# Patient Record
Sex: Female | Born: 1970 | Race: White | Hispanic: No | Marital: Single | Smoking: Never smoker
Health system: Southern US, Community
[De-identification: ages and names within clinical notes are randomized; demographics above are authoritative.]

## PROBLEM LIST (undated history)

## (undated) ENCOUNTER — Inpatient Hospital Stay (HOSPITAL_COMMUNITY): Payer: Self-pay

## (undated) DIAGNOSIS — O10913 Unspecified pre-existing hypertension complicating pregnancy, third trimester: Secondary | ICD-10-CM

## (undated) DIAGNOSIS — O10919 Unspecified pre-existing hypertension complicating pregnancy, unspecified trimester: Secondary | ICD-10-CM

## (undated) DIAGNOSIS — I10 Essential (primary) hypertension: Secondary | ICD-10-CM

## (undated) DIAGNOSIS — G43909 Migraine, unspecified, not intractable, without status migrainosus: Secondary | ICD-10-CM

## (undated) HISTORY — PX: NASAL FRACTURE SURGERY: SHX718

## (undated) HISTORY — PX: NO PAST SURGERIES: SHX2092

---

## 2002-07-07 ENCOUNTER — Other Ambulatory Visit: Admission: RE | Admit: 2002-07-07 | Discharge: 2002-07-07 | Payer: Self-pay | Admitting: Obstetrics & Gynecology

## 2003-07-21 ENCOUNTER — Other Ambulatory Visit: Admission: RE | Admit: 2003-07-21 | Discharge: 2003-07-21 | Payer: Self-pay | Admitting: Obstetrics & Gynecology

## 2004-10-05 ENCOUNTER — Emergency Department (HOSPITAL_COMMUNITY): Admission: EM | Admit: 2004-10-05 | Discharge: 2004-10-05 | Payer: Self-pay | Admitting: Emergency Medicine

## 2010-08-31 ENCOUNTER — Other Ambulatory Visit: Payer: Self-pay | Admitting: Family Medicine

## 2010-08-31 ENCOUNTER — Ambulatory Visit
Admission: RE | Admit: 2010-08-31 | Discharge: 2010-08-31 | Disposition: A | Discharge status: Home / Self Care | Payer: BC Managed Care – PPO | Source: Ambulatory Visit | Attending: Family Medicine | Admitting: Family Medicine

## 2010-08-31 DIAGNOSIS — M79605 Pain in left leg: Secondary | ICD-10-CM

## 2012-06-13 ENCOUNTER — Encounter (HOSPITAL_COMMUNITY): Payer: Self-pay | Admitting: Emergency Medicine

## 2012-06-13 ENCOUNTER — Emergency Department (HOSPITAL_COMMUNITY)
Admission: EM | Admit: 2012-06-13 | Discharge: 2012-06-13 | Disposition: A | Discharge status: Home / Self Care | Payer: BC Managed Care – PPO | Attending: Emergency Medicine | Admitting: Emergency Medicine

## 2012-06-13 DIAGNOSIS — I10 Essential (primary) hypertension: Secondary | ICD-10-CM

## 2012-06-13 DIAGNOSIS — O9989 Other specified diseases and conditions complicating pregnancy, childbirth and the puerperium: Secondary | ICD-10-CM | POA: Insufficient documentation

## 2012-06-13 DIAGNOSIS — O169 Unspecified maternal hypertension, unspecified trimester: Secondary | ICD-10-CM | POA: Insufficient documentation

## 2012-06-13 DIAGNOSIS — G43909 Migraine, unspecified, not intractable, without status migrainosus: Secondary | ICD-10-CM | POA: Insufficient documentation

## 2012-06-13 DIAGNOSIS — Z3490 Encounter for supervision of normal pregnancy, unspecified, unspecified trimester: Secondary | ICD-10-CM

## 2012-06-13 DIAGNOSIS — Z79899 Other long term (current) drug therapy: Secondary | ICD-10-CM | POA: Insufficient documentation

## 2012-06-13 DIAGNOSIS — R110 Nausea: Secondary | ICD-10-CM | POA: Insufficient documentation

## 2012-06-13 HISTORY — DX: Migraine, unspecified, not intractable, without status migrainosus: G43.909

## 2012-06-13 LAB — CBC WITH DIFFERENTIAL/PLATELET
Basophils Relative: 0 % (ref 0–1)
Eosinophils Absolute: 0.1 10*3/uL (ref 0.0–0.7)
HCT: 42.2 % (ref 36.0–46.0)
Hemoglobin: 14.6 g/dL (ref 12.0–15.0)
Lymphs Abs: 2.3 10*3/uL (ref 0.7–4.0)
MCH: 29.8 pg (ref 26.0–34.0)
MCHC: 34.6 g/dL (ref 30.0–36.0)
MCV: 86.1 fL (ref 78.0–100.0)
Monocytes Absolute: 0.5 10*3/uL (ref 0.1–1.0)
Monocytes Relative: 7 % (ref 3–12)
Neutrophils Relative %: 58 % (ref 43–77)
RBC: 4.9 MIL/uL (ref 3.87–5.11)

## 2012-06-13 LAB — URINALYSIS, ROUTINE W REFLEX MICROSCOPIC
Bilirubin Urine: NEGATIVE
Hgb urine dipstick: NEGATIVE
Specific Gravity, Urine: 1.012 (ref 1.005–1.030)
pH: 6.5 (ref 5.0–8.0)

## 2012-06-13 LAB — COMPREHENSIVE METABOLIC PANEL
Alkaline Phosphatase: 67 U/L (ref 39–117)
BUN: 7 mg/dL (ref 6–23)
Creatinine, Ser: 0.54 mg/dL (ref 0.50–1.10)
GFR calc Af Amer: >90 mL/min (ref >90)
Glucose, Bld: 93 mg/dL (ref 70–99)
Potassium: 3.4 mEq/L — ABNORMAL LOW (ref 3.5–5.1)
Total Bilirubin: 0.4 mg/dL (ref 0.3–1.2)
Total Protein: 7.1 g/dL (ref 6.0–8.3)

## 2012-06-13 LAB — POCT PREGNANCY, URINE: Preg Test, Ur: POSITIVE — AB

## 2012-06-13 NOTE — ED Notes (Signed)
Pt presenting to ed with c/o 5-[redacted] weeks pregnant and pt states my blood pressure is high. Pt states "I have dizziness, positive nausea no vomiting and ears ringing. Pt states I have mostly feel dizzy. Pt states I feel like I'm in a time warp. Pt states she was told her HCG levels had dropped and she's suppose to follow up tomorrow for repeat blood work. Pt denies chest pain at this time

## 2012-06-13 NOTE — ED Provider Notes (Signed)
History     CSN: [id number]  Arrival date & time 06/13/12  1753   First MD Initiated Contact with Patient 06/13/12 1923      Chief Complaint  Patient presents with  . Possible Pregnancy  . Dizziness    (Consider location/radiation/quality/duration/timing/severity/associated sxs/prior treatment) The history is provided by the patient.   42 year old female comes in because her blood pressure is elevated today. She monitors her blood pressure at home and most days, readings are normal. Today the blood pressure was 177/100. She is complaining of some very vague dizziness. Of note, she is pregnant with last menstrual period of November 16. She been seen by a gynecologist 3 days ago who estimated that she was 5-[redacted] weeks pregnant based on her hCG level. However, a repeat hCG level yesterday had dropped by 200 and she is scheduled for a repeat hCG level tomorrow. She is gravida 2 para 0 with one voluntary interruption of pregnancy. She has been having some mild intermittent nausea but is not nauseated currently. She did have breast swelling and tenderness several days ago, but that has resolved. She denies visual change and denies vomiting. She denies arthralgias and myalgias. She denies fever or chills.  Past Medical History  Diagnosis Date  . Migraines     No past surgical history on file.  No family history on file.  History  Substance Use Topics  . Smoking status: Never Smoker   . Smokeless tobacco: Not on file  . Alcohol Use: Yes     Comment: rarely    OB History    Grav Para Term Preterm Abortions TAB SAB Ect Mult Living   1               Review of Systems  All other systems reviewed and are negative.    Allergies  Review of patient's allergies indicates no known allergies.  Home Medications   Current Outpatient Rx  Name  Route  Sig  Dispense  Refill  . PRENATAL MULTIVITAMIN CH   Oral   Take 1 tablet by mouth daily.         Marland Kitchen RIZATRIPTAN BENZOATE 5 MG PO  TABS   Oral   Take 5 mg by mouth as needed. May repeat in 2 hours if needed for migraine.           BP 153/93  Pulse 84  Temp 99 F (37.2 C) (Oral)  SpO2 100%  Physical Exam  Nursing note and vitals reviewed.  42 year old female, resting comfortably and in no acute distress. Vital signs are significant for mild hypertension with blood pressure 153/93. Oxygen saturation is 100%, which is normal. Head is normocephalic and atraumatic. PERRLA, EOMI. Oropharynx is clear. Neck is nontender and supple without adenopathy or JVD. Back is nontender and there is no CVA tenderness. Lungs are clear without rales, wheezes, or rhonchi. Chest is nontender. Heart has regular rate and rhythm without murmur. Abdomen is soft, flat, nontender without masses or hepatosplenomegaly and peristalsis is normoactive. Extremities have no cyanosis or edema, full range of motion is present. Skin is warm and dry without rash. Neurologic: Mental status is normal, cranial nerves are intact, there are no motor or sensory deficits.  ED Course  Procedures (including critical care time)  Results for orders placed during the hospital encounter of 06/13/12  CBC WITH DIFFERENTIAL      Component Value Range   WBC 6.7  4.0 - 10.5 K/uL   RBC 4.90  3.87 -  5.11 MIL/uL   Hemoglobin 14.6  12.0 - 15.0 g/dL   HCT 16.1  09.6 - 04.5 %   MCV 86.1  78.0 - 100.0 fL   MCH 29.8  26.0 - 34.0 pg   MCHC 34.6  30.0 - 36.0 g/dL   RDW 40.9  81.1 - 91.4 %   Platelets 187  150 - 400 K/uL   Neutrophils Relative 58  43 - 77 %   Neutro Abs 3.8  1.7 - 7.7 K/uL   Lymphocytes Relative 34  12 - 46 %   Lymphs Abs 2.3  0.7 - 4.0 K/uL   Monocytes Relative 7  3 - 12 %   Monocytes Absolute 0.5  0.1 - 1.0 K/uL   Eosinophils Relative 1  0 - 5 %   Eosinophils Absolute 0.1  0.0 - 0.7 K/uL   Basophils Relative 0  0 - 1 %   Basophils Absolute 0.0  0.0 - 0.1 K/uL  COMPREHENSIVE METABOLIC PANEL      Component Value Range   Sodium 138  135 - 145  mEq/L   Potassium 3.4 (*) 3.5 - 5.1 mEq/L   Chloride 102  96 - 112 mEq/L   CO2 25  19 - 32 mEq/L   Glucose, Bld 93  70 - 99 mg/dL   BUN 7  6 - 23 mg/dL   Creatinine, Ser 7.82  0.50 - 1.10 mg/dL   Calcium 9.4  8.4 - 95.6 mg/dL   Total Protein 7.1  6.0 - 8.3 g/dL   Albumin 4.2  3.5 - 5.2 g/dL   AST 16  0 - 37 U/L   ALT 12  0 - 35 U/L   Alkaline Phosphatase 67  39 - 117 U/L   Total Bilirubin 0.4  0.3 - 1.2 mg/dL   GFR calc non Af Amer >90  >90 mL/min   GFR calc Af Amer >90  >90 mL/min  POCT PREGNANCY, URINE      Component Value Range   Preg Test, Ur POSITIVE (*) NEGATIVE    1. Hypertension   2. Pregnancy       MDM  Isolated elevated blood pressure readings which does not warrant any intervention. I explained this to the patient. The important thing is that her usual blood pressure readings are normal. Does have a family history of hypertension and she is advised to continue monitoring her blood pressure but is reassured that isolated elevated readings do not require treatment. She is to followup with her gynecologist tomorrow as scheduled. Other symptoms are minor and did not require any intervention and her workup today.        Dione Booze, MD 06/13/12 [phone]

## 2012-06-13 NOTE — ED Notes (Signed)
MD at bedside.  Dr. Glick at bedside 

## 2012-10-14 LAB — OB RESULTS CONSOLE ABO/RH: RH Type: POSITIVE

## 2012-10-14 LAB — OB RESULTS CONSOLE RPR: RPR: NONREACTIVE

## 2012-10-14 LAB — OB RESULTS CONSOLE HGB/HCT, BLOOD
HCT: 38 %
Hemoglobin: 12.8 g/dL

## 2012-10-23 LAB — OB RESULTS CONSOLE GC/CHLAMYDIA: Gonorrhea: NEGATIVE

## 2012-12-25 ENCOUNTER — Other Ambulatory Visit: Payer: Self-pay

## 2012-12-27 ENCOUNTER — Encounter (HOSPITAL_COMMUNITY): Payer: Self-pay | Admitting: Obstetrics & Gynecology

## 2013-01-06 ENCOUNTER — Other Ambulatory Visit (HOSPITAL_COMMUNITY): Payer: Self-pay | Admitting: Obstetrics & Gynecology

## 2013-01-06 DIAGNOSIS — O09529 Supervision of elderly multigravida, unspecified trimester: Secondary | ICD-10-CM

## 2013-01-06 DIAGNOSIS — R9389 Abnormal findings on diagnostic imaging of other specified body structures: Secondary | ICD-10-CM

## 2013-01-07 ENCOUNTER — Ambulatory Visit (HOSPITAL_COMMUNITY)
Admission: RE | Admit: 2013-01-07 | Discharge: 2013-01-07 | Disposition: A | Discharge status: Home / Self Care | Payer: BC Managed Care – PPO | Source: Ambulatory Visit | Attending: Obstetrics & Gynecology | Admitting: Obstetrics & Gynecology

## 2013-01-07 ENCOUNTER — Other Ambulatory Visit: Payer: Self-pay

## 2013-01-07 ENCOUNTER — Encounter (HOSPITAL_COMMUNITY): Payer: Self-pay

## 2013-01-07 ENCOUNTER — Other Ambulatory Visit (HOSPITAL_COMMUNITY): Payer: Self-pay | Admitting: Obstetrics & Gynecology

## 2013-01-07 VITALS — BP 116/76 | HR 75 | Wt 155.0 lb

## 2013-01-07 DIAGNOSIS — O4412 Placenta previa with hemorrhage, second trimester: Secondary | ICD-10-CM

## 2013-01-07 DIAGNOSIS — R9389 Abnormal findings on diagnostic imaging of other specified body structures: Secondary | ICD-10-CM

## 2013-01-07 DIAGNOSIS — O358XX1 Maternal care for other (suspected) fetal abnormality and damage, fetus 1: Secondary | ICD-10-CM

## 2013-01-07 DIAGNOSIS — O4400 Placenta previa specified as without hemorrhage, unspecified trimester: Secondary | ICD-10-CM | POA: Insufficient documentation

## 2013-01-07 DIAGNOSIS — Z1389 Encounter for screening for other disorder: Secondary | ICD-10-CM | POA: Insufficient documentation

## 2013-01-07 DIAGNOSIS — O10019 Pre-existing essential hypertension complicating pregnancy, unspecified trimester: Secondary | ICD-10-CM | POA: Insufficient documentation

## 2013-01-07 DIAGNOSIS — O09522 Supervision of elderly multigravida, second trimester: Secondary | ICD-10-CM

## 2013-01-07 DIAGNOSIS — O09529 Supervision of elderly multigravida, unspecified trimester: Secondary | ICD-10-CM | POA: Insufficient documentation

## 2013-01-07 DIAGNOSIS — O4402 Placenta previa specified as without hemorrhage, second trimester: Secondary | ICD-10-CM

## 2013-01-07 DIAGNOSIS — Z363 Encounter for antenatal screening for malformations: Secondary | ICD-10-CM | POA: Insufficient documentation

## 2013-01-07 DIAGNOSIS — O358XX0 Maternal care for other (suspected) fetal abnormality and damage, not applicable or unspecified: Secondary | ICD-10-CM | POA: Insufficient documentation

## 2013-01-07 LAB — CHROMOSOMES ANALYSIS FOR CF

## 2013-01-07 NOTE — Consult Note (Signed)
MFM consult  [age] yr old G4P0030 at [redacted]w[redacted]d with chronic hypertension referred by Dr. Seymour Bars for fetal anatomic survey and consult secondary to finding of echogenic bowel on outside ultrasound.  Past OB hx: 2 miscarriages; 1 termination PMH: hypertension- since early 60s  Ultrasound today shows: fetal biometry is consistent with dating. Posterior placenta that is low lying; the placental edge is 1.6cm from the internal cervical os. Normal amniotic fluid volume. Normal transvaginal cervical length. The views of the ductal and aortic arch are limited. The remainder of the limited anatomy survey is normal. The fetal bowel appears normal.  I discussed the findings of the ultrasound with the patient and counseled her as follows: 1. Appropriate fetal growth. 2. Limited anatomy survey: - recommend follow up in 4 weeks to complete anatomic survey and for fetal growth 3. Chronic hypertension: I discussed that women with preexistent hypertension are at increased risk of adverse pregnancy outcome, including preeclampsia, abruption, fetal growth restriction, and perinatal death. The risk increases with severity of hypertension and presence of end-organ damage. Risk of superimposed preeclampsia is 10 to 25 %; risk of abruption is 0.7 to 1.5 %; and risk of fetal growth restriction is 8 to 16 %. I also discussed that risk of preterm delivery is increased and is usually iatrogenic from the complications listed above. Risk of preterm delivery in women with chronic hypertension is 12-34%. There is also an increased risk of requiring C section for the above reasons. Patient' hypertension is currently managed with methyldopa and labetalol. We discussed that methyldopa is a category B medication and is considered safe in pregnancy as adverse events have not been seen with use of this medication in pregnancy.Labetalol is a category C medication and may be associated with an increased risk of fetal growth restriction;  however is consider safe for use in pregnancy. Patient reports blood pressures were not well controlled with methyldopa alone and is only on labetalol 100mg  bid. Would recommend discontinue methyldopa and treat with single agent labetalol and titrate as needed I would recommend targeting therapy to keep systolic blood pressures <150 and diastolic blood pressures <100. I recommend obtaining baseline studies: EKG, 24 hour urine for total protein, CBC, AST, ALT, BUN, creatinine, and uric acid. I recommend serial ultrasounds for fetal growth every 4 weeks starting at [redacted] weeks gestation. I recommend close surveillance for the development of signs/symptoms of preeclampsia especially in the third trimester. I recommend antenatal testing to start at [redacted] weeks gestation. - recommend delivery by estimated due date but not prior to 38 weeks in the absence of other complications 4. Advanced maternal age: - discussed increased risk of fetal aneuploidy - patient met with genetic counselor; see separate report - after counseling patient opted to proceed with cell free fetal DNA which was drawn today  With maternal age over 67 there is increased risk of gestational diabetes, fetal growth restriction, need for Cesarean delivery, and stillbirth. Recommend starting fetal kick counts at [redacted] weeks gestation. - recommend fetal growth, antenatal testing, and delivery as above 5. Previous finding of echogenic bowel: - discussed that this was not seen on today's ultrasound - I discussed the associations of echogenic bowel with fetal aneuploidy, cystic fibrosis, congenital infection, and bleeding.  Aneuploidy is found in 3-25% of fetuses with echogenic bowel.  Cystic fibrosis is found in 3-8% of fetuses with echogenic bowel.  There is a higher risk of fetal growth restriction in fetuses with echogenic bowel (up to 20%).  Congenital infections such as  toxoplasmosis and CMV have also been associated with echogenic bowel.   Given the above patient was counseled and opted to proceed with screening: - cell free fetal DNA, cystic fibrosis, and toxomplasmosis and CMV IgG and IgM drawn today 6. Low lying placenta: - discussed increased risk of bleeding; patient is to call primary OB with vagina bleeding - discussed will reevaluate on follow up ultrasounds as there is a high likelihood this will resolve prior to delivery; delivery recommendations will be made based on placental location at the time - no activity restrictions recommended unless bleeding develops - recommend bleeding precautions - recommend reevaluate placental location on follow up ultrasound  I spent 45 minutes in face to face consultation with the patient in addition to time spent on the ultrasound.  Eulis Foster, MD

## 2013-01-07 NOTE — Progress Notes (Signed)
[facility] ultrasound   Indication: 19/42 yr old G34P0030 at [redacted]w[redacted]d with chronic hypertension for fetal anatomic survey. Finding of echogenic bowel on outside ultrasound.  Findings: 1. Single intrauterine pregnancy. 2. Fetal biometry is consistent with dating. 3. Posterior placenta that is low lying; the placental edge is 1.6cm from the internal cervical os. 4. Normal amniotic fluid volume. 5. Normal transvaginal cervical length. 6. The views of the ductal and aortic arch are limited. 7. The remainder of the limited anatomy survey is normal. The fetal bowel appears normal.  Recommendations: 1. Appropriate fetal growth. 2. Limited anatomy survey: - recommend follow up in 4 weeks to complete anatomic survey and for fetal growth 3. Chronic hypertension: - see consult letter - recommend fetal growth every 4 weeks - recommend antenatal testing starting at [redacted] weeks gestation - recommend delivery by estimated due date but not prior to 38 weeks in the absence of other complications - recommend close surveillance for the development of signs/symptoms of preeclampsia 4. Advanced maternal age: - see consult letter - patient met with genetic counselor; see separate report - after counseling patient opted to proceed with cell free fetal DNA which was drawn today - recommend fetal growth, antenatal testing, and delivery as above 5. Previous finding of echogenic bowel: - see consult letter - not seen on today's ultrasound - cell free fetal DNA, cystic fibrosis, and toxomplasmosis and CMV IgG and IgM drawn today 6. Low lying placenta: - see consult letter - no activity restrictions recommended unless bleeding develops - recommend bleeding precautions - recommend reevaluate placental location on follow up ultrasound  Eulis Foster, MD

## 2013-01-07 NOTE — Progress Notes (Signed)
Genetic Counseling  High-Risk Gestation Note  Appointment Date:  01/07/2013 Referred By: Genia Del, MD Date of Birth:  Sep 30, 1970    Pregnancy History: G4P0030 Estimated Date of Delivery: 05/20/13 Estimated Gestational Age: [redacted]w[redacted]d Attending: Eulis Foster, MD  Ms. Kayla Hill was seen for genetic counseling because of a maternal age of 42 y.o.Marland Kitchen  The patient will be 42 years old at delivery. Echogenic bowel was reportedly visualized on previous, outside ultrasound.   She was counseled regarding maternal age and the association with risk for chromosome conditions due to nondisjunction with aging of the ova.   We reviewed chromosomes, nondisjunction, and the associated 1 in 35 risk for fetal aneuploidy at [redacted]w[redacted]d gestation related to a maternal age of 42 y.o. at delivery.  She was counseled that the risk for aneuploidy decreases as gestational age increases, accounting for those pregnancies which spontaneously abort.  We specifically discussed Down syndrome (trisomy 43), trisomies 13 and 65, and sex chromosome aneuploidies (47,XXX and 47,XXY) including the common features and prognoses of each.   We reviewed available screening options including Quad screen, noninvasive prenatal screening (NIPS)/cell free fetal DNA (cffDNA) testing, and detailed ultrasound.  Ms. Kayla Hill previously had maternal serum AFP only performed which was within normal range, decreasing the risk for fetal open neural tube defects. She was counseled that screening tests are used to modify a patient's a priori risk for aneuploidy, typically based on age. This estimate provides a pregnancy specific risk assessment. We reviewed the benefits and limitations of each option. Specifically, we discussed the conditions for which each test screens, the detection rates, and false positive rates of each. She was also counseled regarding diagnostic testing via amniocentesis. We reviewed the approximate 1 in 300-500 risk for  complications for amniocentesis, including spontaneous pregnancy loss. After consideration of all the options, she elected to proceed with NIPS (Panorama) at the time of today's visit.  Those results will be available in approximately 8-10 days.  Ms. Kayla Hill indicated that she and her partner would not likely be interested in amniocentesis in the pregnancy, given the associated risk of complications.   A detailed ultrasound was performed today. The ultrasound report will be sent under separate cover. There were no visualized fetal anomalies or markers suggestive of aneuploidy. Echogenic bowel was not visualized today. Diagnostic testing was declined today.  She understands that screening tests cannot rule out all birth defects or genetic syndromes. The patient was advised of this limitation and states she still does not want additional testing at this time.   Ms. Kayla Hill was provided with information regarding cystic fibrosis (CF) including the carrier frequency and incidence in the Caucasian population, the availability of carrier testing and prenatal diagnosis if indicated.  In addition, we discussed that CF is routinely screened for as part of the [facility] newborn screening panel.  After careful consideration, she elected to proceed with CF carrier screening at the time of today's visit.    Both family histories were reviewed and found to be noncontributory for birth defects, mental retardation, and known genetic conditions. Without further information regarding the provided family history, an accurate genetic risk cannot be calculated. Further genetic counseling is warranted if more information is obtained.  Ms. Kayla Hill denied exposure to environmental toxins or chemical agents. She denied the use of alcohol, tobacco or street drugs. She denied significant viral illnesses during the course of her pregnancy. Her medical and surgical histories were contributory for hypertension, for which she takes  labetalol.  I counseled Ms. Kayla Hill regarding the above risks and available options.  The approximate face-to-face time with the genetic counselor was 35 minutes.  Quinn Plowman, MS,  Certified Genetic Counselor 01/07/2013

## 2013-01-09 LAB — TOXOPLASMA ANTIBODIES- IGG AND  IGM: Toxoplasma Antibody- IgM: <3 IU/mL (ref <8.0)

## 2013-01-10 NOTE — Addendum Note (Signed)
Encounter addended by: Ty Hilts, RN on: 01/10/2013 11:16 AM<BR>     Documentation filed: Notes Section

## 2013-01-10 NOTE — ED Notes (Signed)
Elevated CMV result called to Dr. Vincenza Hews.

## 2013-01-15 ENCOUNTER — Other Ambulatory Visit: Payer: Self-pay

## 2013-01-15 ENCOUNTER — Ambulatory Visit (HOSPITAL_COMMUNITY)
Admission: RE | Admit: 2013-01-15 | Discharge: 2013-01-15 | Disposition: A | Discharge status: Home / Self Care | Payer: BC Managed Care – PPO | Source: Ambulatory Visit | Attending: Obstetrics & Gynecology | Admitting: Obstetrics & Gynecology

## 2013-01-15 DIAGNOSIS — O10019 Pre-existing essential hypertension complicating pregnancy, unspecified trimester: Secondary | ICD-10-CM | POA: Insufficient documentation

## 2013-01-15 DIAGNOSIS — O4400 Placenta previa specified as without hemorrhage, unspecified trimester: Secondary | ICD-10-CM | POA: Insufficient documentation

## 2013-01-15 DIAGNOSIS — O358XX0 Maternal care for other (suspected) fetal abnormality and damage, not applicable or unspecified: Secondary | ICD-10-CM | POA: Insufficient documentation

## 2013-01-17 ENCOUNTER — Telehealth (HOSPITAL_COMMUNITY): Payer: Self-pay | Admitting: MS"

## 2013-01-17 NOTE — Telephone Encounter (Signed)
Called Kayla Hill to discuss her cell free fetal DNA test results.  Mrs. Sharyn Lull Czaja had Panorama testing through [facility].  Testing was offered because of advanced maternal age and previous ultrasound finding of echogenic bowel.   The patient was identified by name and DOB.  We reviewed that these are within normal limits, showing a less than 1 in 10,000 risk for trisomies 21, 18 and 13, and monosomy X (Turner syndrome).  In addition, the risk for triploidy/vanishing twin and sex chromosome trisomies (47,XXX and 47,XXY) was also low risk. We reviewed that this testing identifies > 99% of pregnancies with trisomy 59, trisomy 36, trisomy 19, sex chromosome trisomies (47,XXX and 47,XXY), and triploidy.  The detection rate for monosomy X is ~92%.  The false positive rate is <0.1% for all conditions. Testing was also consistent with female gender.  She understands that this testing does not identify all genetic conditions.   Additionally, we reviewed that carrier screening for cystic fibrosis, performed through [facility] yielded a normal/negative result for the 32 most common disease-causing mutations, meaning that the risk to be a CF carrier can be reduced from 1 in 25 to approximately 1 in 125.  Therefore, the risk for cystic fibrosis in her current pregnancy is significantly reduced. The patient understands that CF carrier screening can reduce but not eliminate the chance to be a CF carrier.  All questions were answered to her satisfaction, she was encouraged to call with additional questions or concerns.  Quinn Plowman, MS Certified Genetic Counselor 01/17/2013 8:50 AM

## 2013-02-05 ENCOUNTER — Ambulatory Visit (HOSPITAL_COMMUNITY)
Admission: RE | Admit: 2013-02-05 | Discharge: 2013-02-05 | Disposition: A | Discharge status: Home / Self Care | Payer: BC Managed Care – PPO | Source: Ambulatory Visit | Attending: Obstetrics & Gynecology | Admitting: Obstetrics & Gynecology

## 2013-02-05 DIAGNOSIS — O4400 Placenta previa specified as without hemorrhage, unspecified trimester: Secondary | ICD-10-CM | POA: Insufficient documentation

## 2013-02-05 DIAGNOSIS — O10019 Pre-existing essential hypertension complicating pregnancy, unspecified trimester: Secondary | ICD-10-CM | POA: Insufficient documentation

## 2013-02-05 DIAGNOSIS — O4412 Placenta previa with hemorrhage, second trimester: Secondary | ICD-10-CM

## 2013-02-05 DIAGNOSIS — O358XX0 Maternal care for other (suspected) fetal abnormality and damage, not applicable or unspecified: Secondary | ICD-10-CM | POA: Insufficient documentation

## 2013-02-05 DIAGNOSIS — O09529 Supervision of elderly multigravida, unspecified trimester: Secondary | ICD-10-CM

## 2013-02-05 DIAGNOSIS — Z363 Encounter for antenatal screening for malformations: Secondary | ICD-10-CM | POA: Insufficient documentation

## 2013-02-05 DIAGNOSIS — Z1389 Encounter for screening for other disorder: Secondary | ICD-10-CM | POA: Insufficient documentation

## 2013-02-20 ENCOUNTER — Encounter (HOSPITAL_COMMUNITY): Payer: Self-pay | Admitting: *Deleted

## 2013-02-20 ENCOUNTER — Inpatient Hospital Stay (HOSPITAL_COMMUNITY)
Admission: AD | Admit: 2013-02-20 | Discharge: 2013-02-20 | Disposition: A | Discharge status: Home / Self Care | Payer: BC Managed Care – PPO | Source: Ambulatory Visit | Attending: Obstetrics & Gynecology | Admitting: Obstetrics & Gynecology

## 2013-02-20 DIAGNOSIS — O99891 Other specified diseases and conditions complicating pregnancy: Secondary | ICD-10-CM | POA: Insufficient documentation

## 2013-02-20 DIAGNOSIS — Y9289 Other specified places as the place of occurrence of the external cause: Secondary | ICD-10-CM | POA: Insufficient documentation

## 2013-02-20 HISTORY — DX: Essential (primary) hypertension: I10

## 2013-02-20 MED ORDER — LABETALOL HCL 100 MG PO TABS
100.0000 mg | ORAL_TABLET | Freq: Once | ORAL | Status: AC
  Administered 2013-02-20: 100 mg via ORAL
  Filled 2013-02-20: qty 1

## 2013-02-20 MED ORDER — METHYLDOPA 250 MG PO TABS
250.0000 mg | ORAL_TABLET | Freq: Once | ORAL | Status: AC
  Administered 2013-02-20: 250 mg via ORAL
  Filled 2013-02-20: qty 1
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
## 2013-03-05 ENCOUNTER — Ambulatory Visit (HOSPITAL_COMMUNITY): Payer: BC Managed Care – PPO

## 2013-04-08 ENCOUNTER — Encounter (HOSPITAL_COMMUNITY): Payer: Self-pay

## 2013-04-08 ENCOUNTER — Inpatient Hospital Stay (HOSPITAL_COMMUNITY)
Admission: AD | Admit: 2013-04-08 | Discharge: 2013-04-12 | DRG: 781 | Disposition: A | Discharge status: Home / Self Care | Payer: BC Managed Care – PPO | Source: Ambulatory Visit | Attending: Obstetrics & Gynecology | Admitting: Obstetrics & Gynecology

## 2013-04-08 DIAGNOSIS — O09529 Supervision of elderly multigravida, unspecified trimester: Secondary | ICD-10-CM | POA: Diagnosis present

## 2013-04-08 DIAGNOSIS — O10019 Pre-existing essential hypertension complicating pregnancy, unspecified trimester: Principal | ICD-10-CM | POA: Diagnosis present

## 2013-04-08 DIAGNOSIS — E876 Hypokalemia: Secondary | ICD-10-CM | POA: Diagnosis present

## 2013-04-08 DIAGNOSIS — O133 Gestational [pregnancy-induced] hypertension without significant proteinuria, third trimester: Secondary | ICD-10-CM

## 2013-04-08 DIAGNOSIS — O10913 Unspecified pre-existing hypertension complicating pregnancy, third trimester: Secondary | ICD-10-CM | POA: Diagnosis present

## 2013-04-08 HISTORY — DX: Unspecified pre-existing hypertension complicating pregnancy, third trimester: O10.913

## 2013-04-08 LAB — COMPREHENSIVE METABOLIC PANEL
ALT: 19 U/L (ref 0–35)
AST: 24 U/L (ref 0–37)
Alkaline Phosphatase: 112 U/L (ref 39–117)
CO2: 24 mEq/L (ref 19–32)
Calcium: 8.8 mg/dL (ref 8.4–10.5)
Chloride: 100 mEq/L (ref 96–112)
GFR calc Af Amer: >90 mL/min (ref >90)
GFR calc non Af Amer: >90 mL/min (ref >90)
Sodium: 135 mEq/L (ref 135–145)

## 2013-04-08 LAB — URINALYSIS, ROUTINE W REFLEX MICROSCOPIC
Bilirubin Urine: NEGATIVE
Glucose, UA: NEGATIVE mg/dL
Hgb urine dipstick: NEGATIVE
Specific Gravity, Urine: >1.03 — ABNORMAL HIGH (ref 1.005–1.030)

## 2013-04-08 MED ORDER — ACETAMINOPHEN 325 MG PO TABS: 650.0000 mg | ORAL_TABLET | ORAL | Status: DC | PRN

## 2013-04-08 MED ORDER — LABETALOL HCL 5 MG/ML IV SOLN
20.0000 mg | Freq: Once | INTRAVENOUS | Status: AC
  Administered 2013-04-08: 20 mg via INTRAVENOUS
  Filled 2013-04-08: qty 4

## 2013-04-08 MED ORDER — METHYLDOPA 250 MG PO TABS
250.0000 mg | ORAL_TABLET | Freq: Two times a day (BID) | ORAL | Status: DC
  Administered 2013-04-08 – 2013-04-09 (×3): 250 mg via ORAL
  Filled 2013-04-08 (×4): qty 1

## 2013-04-08 MED ORDER — LACTATED RINGERS IV SOLN
INTRAVENOUS | Status: DC
  Administered 2013-04-08 – 2013-04-10 (×6): via INTRAVENOUS

## 2013-04-08 MED ORDER — CALCIUM CARBONATE ANTACID 500 MG PO CHEW: 2.0000 | CHEWABLE_TABLET | ORAL | Status: DC | PRN

## 2013-04-08 MED ORDER — ZOLPIDEM TARTRATE 5 MG PO TABS
5.0000 mg | ORAL_TABLET | Freq: Every evening | ORAL | Status: DC | PRN
  Administered 2013-04-11: 5 mg via ORAL
  Filled 2013-04-08 (×2): qty 1

## 2013-04-08 MED ORDER — DOCUSATE SODIUM 100 MG PO CAPS
100.0000 mg | ORAL_CAPSULE | Freq: Every day | ORAL | Status: DC
  Administered 2013-04-09 – 2013-04-11 (×3): 100 mg via ORAL
  Filled 2013-04-08 (×5): qty 1

## 2013-04-08 MED ORDER — PRENATAL MULTIVITAMIN CH
1.0000 | ORAL_TABLET | Freq: Every day | ORAL | Status: DC
  Administered 2013-04-09 – 2013-04-12 (×4): 1 via ORAL
  Filled 2013-04-08 (×4): qty 1

## 2013-04-08 MED ORDER — LABETALOL HCL 200 MG PO TABS
200.0000 mg | ORAL_TABLET | Freq: Three times a day (TID) | ORAL | Status: DC
  Administered 2013-04-08 – 2013-04-10 (×5): 200 mg via ORAL
  Filled 2013-04-08 (×6): qty 1

## 2013-04-08 NOTE — MAU Provider Note (Signed)
Reviewed and agree with note and plan. V.Fynn Vanblarcom, MD  

## 2013-04-08 NOTE — Progress Notes (Signed)
Dr. Juliene Pina @ nurse's station, has reviewed BP readings

## 2013-04-08 NOTE — Progress Notes (Signed)
Dr. Juliene Pina into see pt.  MD @ bedside discussing POC.  Plan includes to monitor BP's, administer meds for increased BP's, initiate 24 hour urine, & monitor FHR.  Pt verbalized understanding & agreeable.

## 2013-04-08 NOTE — H&P (Signed)
OB ADMISSION/ HISTORY & PHYSICAL:  Admission Date: 04/08/2013  6:13 PM  Admit Diagnosis: CHTN affecting pregnancy, antepartum  Kayla Hill is a 42 y.o. female presenting for [facility] with unstable BPs on Labetalol.  She was seen at [facility] today; [facility] labs pending  Prenatal History: G4P0030   EDC : 05/20/2013, by Last Menstrual Period  Prenatal care at [facility] since 8.[redacted] weeks gestation Primary Care Provider at [facility]: Seymour Bars  Prenatal course complicated by [facility]  Prenatal Labs: ABO, Rh:   O Positive  Antibody:  Negative   Rubella:   Immune RPR:   Non-Reactive  HBsAg:   Non-Reactive HIV:   Non-Reactive GBS:   Unknown 1 hr Glucola : 201 mg/dL 3 hr Glucola: FBS 79, 1 hr 138, 2 hr  138, 3 hr 110   Medical / Surgical History :  Past medical history:  Past Medical History  Diagnosis Date  . Migraines   . Hypertension      Past surgical history:  Past Surgical History  Procedure Laterality Date  . Nasal fracture surgery       Family History:  Family History  Problem Relation Age of Onset  . Hypertension Mother   . Hypertension Father   . Cancer Maternal Aunt   . Hypertension Maternal Aunt      Social History:  reports that she has never smoked. She does not have any smokeless tobacco history on file. She reports that she does not drink alcohol or use illicit drugs.   Allergies: Review of patient's allergies indicates no known allergies.    Current Medications at time of admission:  Prescriptions prior to admission  Medication Sig Dispense Refill  . aspirin EC 81 MG tablet Take 81 mg by mouth daily.      . folic acid (FOLVITE) 1 MG tablet Take 1 tablet by mouth daily.      Marland Kitchen labetalol (NORMODYNE) 100 MG tablet Take 1 tablet by mouth 2 (two) times daily.      . methyldopa (ALDOMET) 500 MG tablet Take 0.5 tablets by mouth 2 (two) times daily.      . Prenatal Vit-Fe Fumarate-FA (PRENATAL MULTIVITAMIN) TABS Take 1 tablet by  mouth daily.          Review of Systems: Review of Systems  Constitutional: Negative.   HENT: Negative.   Eyes: Negative.   Respiratory: Negative.   Cardiovascular: Negative.   Gastrointestinal: Negative.   Genitourinary: Negative.   Musculoskeletal: Negative.   Skin: Negative.   Neurological: Negative.   Endo/Heme/Allergies: Negative.   Psychiatric/Behavioral: Negative.     Physical Exam:   See MAU Provider note for previous BPs Filed Vitals:   04/08/13 2017  BP: 150/83  Pulse: 80  Resp:     General: A&O x3, NAD Heart: RRR, no murmurs Lungs: CTAB Abdomen: soft, non tender, normal bowel sounds Extremities: normal ROM, 3+ pitting edema FHR: 140 TOCO: Occ  Labs:    PIH labs pending from office   Assessment:  41 y.o. G4P0030 at [redacted]w[redacted]d  1. CHTN 2. AMA 3. Fetal Wellbeing: Category 1     Plan:  1. Admit to antenatal 2. Routine Antenatal orders 3. 24 hour urine collection    Dr Juliene Pina notified of admission / plan of care - will come evaluate patient    Kenard Gower, MSN, CNM 04/08/2013, 8:27 PM

## 2013-04-08 NOTE — H&P (Signed)
Reviewed and agree with note and plan. V.Mikki Ziff, MD  

## 2013-04-08 NOTE — MAU Note (Signed)
Patient state she was seen in the office to day and sent to MAU for evaluation of elevated blood pressure. States she has been having lower back pain for a while. Denies bleeding or leaking and reports good fetal movement.

## 2013-04-08 NOTE — MAU Provider Note (Signed)
  History     CSN: [id number]  Arrival date and time: 04/08/13 1812 Provider on unit: 1900 Provider at bedside: 1920     Chief Complaint  Patient presents with  . Hypertension  . Back Pain   HPI  Ms. Kayla Hill 42 y.o. [id number] female at 56 wks presenting after being seen in the office today with  a BP of 160/100.  She is asymptomatic.  She denies VB or LOF.    Past Medical History  Diagnosis Date  . Migraines   . Hypertension     Past Surgical History  Procedure Laterality Date  . Nasal fracture surgery      Family History  Problem Relation Age of Onset  . Hypertension Mother   . Hypertension Father   . Cancer Maternal Aunt   . Hypertension Maternal Aunt     History  Substance Use Topics  . Smoking status: Never Smoker   . Smokeless tobacco: Not on file  . Alcohol Use: No    Allergies: No Known Allergies  Prescriptions prior to admission  Medication Sig Dispense Refill  . aspirin EC 81 MG tablet Take 81 mg by mouth daily.      . folic acid (FOLVITE) 1 MG tablet Take 1 tablet by mouth daily.      Marland Kitchen labetalol (NORMODYNE) 100 MG tablet Take 1 tablet by mouth 2 (two) times daily.      . methyldopa (ALDOMET) 500 MG tablet Take 0.5 tablets by mouth 2 (two) times daily.      . Prenatal Vit-Fe Fumarate-FA (PRENATAL MULTIVITAMIN) TABS Take 1 tablet by mouth daily.        Review of Systems  Constitutional: Negative.   HENT: Negative.   Eyes: Negative.   Respiratory: Negative.   Cardiovascular: Negative.   Gastrointestinal: Negative.   Genitourinary: Negative.   Musculoskeletal: Negative.   Skin: Negative.   Neurological: Negative.   Endo/Heme/Allergies: Negative.   Psychiatric/Behavioral: Negative.    Physical Exam  Initial BP after arrival to MAU 179/111, 155/96, 157/93 Blood pressure 157/93, pulse 69, resp. rate 16, height 5\' 8"  (1.727 m), weight 78.926 kg (174 lb), last menstrual period 08/13/2012, SpO2 97.00%.  Physical Exam  Constitutional: She  is oriented to person, place, and time. She appears well-developed and well-nourished.  HENT:  Head: Normocephalic and atraumatic.  Eyes: EOM are normal.  Neck: Normal range of motion. Neck supple.  Cardiovascular: Normal rate, regular rhythm and normal heart sounds.   Respiratory: Effort normal and breath sounds normal.  GI: Soft. Bowel sounds are normal.  Genitourinary:  gravid  Musculoskeletal: Normal range of motion.  Neurological: She is alert and oriented to person, place, and time. She has normal reflexes.  1+ DTRs  Skin: Skin is warm and dry.  3+ pitting edema  Psychiatric: She has a normal mood and affect. Her behavior is normal. Judgment and thought content normal.    MAU Course  Procedures EFM Serial BPs LR IV @ 125 mL/hr CMP Assessment and Plan  IUP @ [redacted] wks gestation Chronic HTN - labile on Labetalol AMA FHR: Category 1   Admit to Antenatal Unit LR IV @ 125 mL/hr Labetalol 20 mg IVP PIH labs pending from office 24 hr urine collection while inpatient  *Dr. Juliene Pina consulted on plan/admission   Kenard Gower, MSN, CNM 04/08/2013, 7:27 PM

## 2013-04-08 NOTE — Progress Notes (Signed)
GBS culture obtained.

## 2013-04-08 NOTE — MAU Note (Signed)
PT   SAYS  SHE  DID NOT TAKE 3RD DOSE OF MED  TONIGHT. DENIES H/A, BLURRED VISION, CHEST PAIN,  .

## 2013-04-08 NOTE — H&P (Signed)
Kayla Hill is a 42 y.o. female admitted from MAU for BP control.  Patient is 42 yo, G4P0030 at 34 wks, with CHTN on Labetalol (200mg  bid) and Aldomet 250mg  at bedtime, was seen in office for her Ob visit, was noted to have more elevated BP over her baseline CHTN of 150/98 and lower extremity swelling around ankles since 2 wks. BPP 8/8 and normal AFI today. She denies any symptoms of HTN and preeclampsia and reported active fetal movements. She was advised increasing medication dose and report BP after 4 hrs of rest at home. She called back with BP of 160/100 and was asked to check in at the MAU.  In MAU, the initial BP was 179/111, she was given 20mg  IV Labetalol and repeat BPs improved in 150s/80-90 range. She was admitted to Antenatal for BP control and continuous monitoring    [facility], primary Ob Dr Seymour Bars. Started care at 8-9 wks. She is AMA, h/o CHTN. HTN noted since 1st trim, was started on Aldomet first, Labetalol was added from 16 wks. No GDM. Serial growth sono noted EFW at 32 wks at 71%, wkly BPP 8/8. 24 hr urine protein normal and labs normal 2 wks back and normal CBC, CMP, Uric acid were normal today.   Maternal Medical History:  Reason for admission: Nausea.    OB History   Grav Para Term Preterm Abortions TAB SAB Ect Mult Living   4 0 0 0 3 1 2 0 0 0      Past Medical History  Diagnosis Date  . Migraines   . Hypertension    Past Surgical History  Procedure Laterality Date  . Nasal fracture surgery     Family History: family history includes Cancer in her maternal aunt; Hypertension in her father, maternal aunt, and mother. Social History:  reports that she has never smoked. She does not have any smokeless tobacco history on file. She reports that she does not drink alcohol or use illicit drugs.   Prenatal Transfer Tool  Maternal Diabetes: No Genetic Screening: Declined but AFP1 negative Maternal Ultrasounds/Referrals: Normal except echogenic bowel  Fetal  Ultrasounds or other Referrals: none Maternal Substance Abuse:  No Significant Maternal Medications:  Meds include: Other:  Labetalol, Aldomet Significant Maternal Lab Results:  PIH labs normal Other Comments:   Flu vaccine and TDap given  Review of Systems  Constitutional: Negative for fever.  Eyes: Negative for blurred vision.  Respiratory: Negative for shortness of breath.   Cardiovascular: Positive for leg swelling. Negative for chest pain.  Gastrointestinal: Negative for nausea, vomiting and abdominal pain.  Musculoskeletal: Negative for myalgias.  Neurological: Negative for dizziness and headaches.  Psychiatric/Behavioral: Negative for depression.      Blood pressure 159/80, pulse 65, temperature 98.3 F (36.8 C), temperature source Oral, resp. rate 20, height 5\' 8"  (1.727 m), weight 174 lb (78.926 kg), last menstrual period 08/13/2012, SpO2 97.00%. Exam Physical Exam  A&O x 3, no acute distress. Pleasant HEENT neg, no thyromegaly Lungs CTA bilat CV RRR, A1S2 normal Abdo soft, non tender, non acute,  Non tender gravid uterus Extr no edema/ tenderness, +1 ankle swelling. DTR +2/+2 Pelvic deferred FHT 140s/ + accels/ no decels/ moderate variability- category I  Toco none  Prenatal labs: ABO, Rh:  O(+) Antibody:  neg Rubella:  Immune RPR:   Neg HBsAg:   neg  HIV:   neg GBS:   not done - will send from hospital Glucola: 1 hr Abn but passed 3 hr  GTT Flu vaccine and TDap given  Assessment/Plan: 42 yo, G4P0030 at 34 wks with CHTN in pregnancy, with worsening BPs vs superimposed Preeclampsia. Labs normal in office, 24 hr urine starting collection now.  Plan BP control, continuous EFM, MFM consult with Dopplers and BPP and growth in AM. Reviewed risks/complications incl maternal and fetal. Labs in office normal, CMP normal here, plan 24 hr urine collection stat.  Prematurity- check GBS in case delivery needed. NICU consult in AM if BP control not excellent for possible  early delivery.  Kayla Hill R 04/08/2013, 9:25 PM

## 2013-04-09 ENCOUNTER — Inpatient Hospital Stay (HOSPITAL_COMMUNITY): Payer: BC Managed Care – PPO

## 2013-04-09 ENCOUNTER — Encounter (HOSPITAL_COMMUNITY): Payer: Self-pay | Admitting: Obstetrics & Gynecology

## 2013-04-09 DIAGNOSIS — O10913 Unspecified pre-existing hypertension complicating pregnancy, third trimester: Secondary | ICD-10-CM

## 2013-04-09 HISTORY — DX: Unspecified pre-existing hypertension complicating pregnancy, third trimester: O10.913

## 2013-04-09 LAB — CBC
MCHC: 34.4 g/dL (ref 30.0–36.0)
Platelets: 141 10*3/uL — ABNORMAL LOW (ref 150–400)
RBC: 3.57 MIL/uL — ABNORMAL LOW (ref 3.87–5.11)
RDW: 13.2 % (ref 11.5–15.5)
WBC: 7.1 10*3/uL (ref 4.0–10.5)

## 2013-04-09 LAB — COMPREHENSIVE METABOLIC PANEL
Albumin: 2.4 g/dL — ABNORMAL LOW (ref 3.5–5.2)
Alkaline Phosphatase: 97 U/L (ref 39–117)
BUN: 4 mg/dL — ABNORMAL LOW (ref 6–23)
CO2: 24 mEq/L (ref 19–32)
Chloride: 104 mEq/L (ref 96–112)
Creatinine, Ser: 0.48 mg/dL — ABNORMAL LOW (ref 0.50–1.10)
GFR calc Af Amer: >90 mL/min (ref >90)
GFR calc non Af Amer: >90 mL/min (ref >90)
Glucose, Bld: 85 mg/dL (ref 70–99)
Total Bilirubin: 0.2 mg/dL — ABNORMAL LOW (ref 0.3–1.2)

## 2013-04-09 LAB — CREATININE CLEARANCE, URINE, 24 HOUR: Creatinine Clearance: 145 mL/min — ABNORMAL HIGH (ref 75–115)

## 2013-04-09 LAB — URIC ACID: Uric Acid, Serum: 4.1 mg/dL (ref 2.4–7.0)

## 2013-04-09 MED ORDER — LABETALOL HCL 100 MG PO TABS
100.0000 mg | ORAL_TABLET | Freq: Once | ORAL | Status: AC
  Administered 2013-04-09: 100 mg via ORAL
  Filled 2013-04-09: qty 1

## 2013-04-09 MED ORDER — POTASSIUM CHLORIDE CRYS ER 20 MEQ PO TBCR
30.0000 meq | EXTENDED_RELEASE_TABLET | Freq: Every day | ORAL | Status: DC
  Administered 2013-04-09 – 2013-04-11 (×3): 30 meq via ORAL
  Filled 2013-04-09 (×4): qty 1

## 2013-04-09 NOTE — Progress Notes (Signed)
Patient ID: Kayla Hill, female   DOB: October 31, 1970, 42 y.o.   MRN: [id number] Subjective: Slight headache, not sure if from lack of sleep. No CP/SOB/ worse swelling in LE. Good FMs, no leaking/ bleeding or abdo or uterine pain or contractions.   Objective: Vital signs in last 24 hours: Temp:  [97.9 F (36.6 C)-98.3 F (36.8 C)] 98.2 F (36.8 C) (11/12 1157) Pulse Rate:  [53-94] 67 (11/12 1107) Resp:  [16-20] 20 (11/12 1157) BP: (133-179)/(67-111) 167/89 mmHg (11/12 1107) SpO2:  [96 %-100 %] 100 % (11/12 1024) Weight:  [174 lb (78.926 kg)] 174 lb (78.926 kg) (11/11 2037) Weight change:    Intake/Output from previous day: 11/11 0701 - 11/12 0700 In: 1959.2 [P.O.:580; I.V.:1379.2] Out: 3025 [Urine:3025] Intake/Output this shift: Total I/O In: 740 [P.O.:240; I.V.:500] Out: 700 [Urine:700]  Physical exam:  A&O x 3, no acute distress. Pleasant HEENT neg Lungs CTA bilat CV RRR, S1S2 normal Abdo soft, non tender, non acute, relaxed gravid uterus Extr no edema/ tenderness. DTR +1/+1 Pelvic deferred FHT 135/ + accels/ no decels/ moderate variability- category I/ reactive NST Toco rare.  Lab Results:  Recent Labs  04/09/13 0530  WBC 7.1  HGB 10.7*  HCT 31.1*  PLT 141*  Platelets dropped (was 150K in office on 04/08/13)  BMET  Recent Labs  04/08/13 1914 04/09/13 0530  NA 135 136  K 3.0* 2.9*  CL 100 104  CO2 24 24  GLUCOSE 73 85  BUN 7 4*  CREATININE 0.55 0.48*  CALCIUM 8.8 8.6  K low, will replace with PO  Studies/Results: MFM consult/ growth, dopplers, BPP pending  Medications: Labetalol 20mg  IV once 11/11 at admission. Since then 200mg  and one additional 100mg  at night and 250mg  Aldomet. This morning, 200mg  Labetalol and 250mg  Aldomet per schedule and added another 100mg  Labetalol.   Assessment/Plan: 42 yo, with CHTN, worsening HTN vs superimposed PEC. 24 hr urine collection ongoing. Continue BP control with increasing Labetalol doses, plan 300mg   Labetalol TID today and assess BPs.  MFM consult and sono pending, NST reactive, no decels.  Patient counseled on delivery at 37 wks but possibly sooner if not able to control BP well (<140/90 consistently). Neonatal prematurity reviewed. If planning delivery soon, she'll get NICU consult today.  Hypokalemia, replace with K-dur.  GBS pending.    LOS: 1 day   Drema Eddington R 04/09/2013, 12:22 PM

## 2013-04-09 NOTE — Progress Notes (Signed)
Ur chart review completed.  

## 2013-04-10 LAB — CBC
HCT: 29.7 % — ABNORMAL LOW (ref 36.0–46.0)
Hemoglobin: 10.4 g/dL — ABNORMAL LOW (ref 12.0–15.0)
MCH: 30.9 pg (ref 26.0–34.0)
MCV: 88.1 fL (ref 78.0–100.0)
RBC: 3.37 MIL/uL — ABNORMAL LOW (ref 3.87–5.11)
RDW: 13.5 % (ref 11.5–15.5)

## 2013-04-10 LAB — PROTEIN, URINE, 24 HOUR
Collection Interval-UPROT: 24 hours
Protein, Urine: 4 mg/dL
Urine Total Volume-UPROT: 3700 mL

## 2013-04-10 MED ORDER — LABETALOL HCL 300 MG PO TABS
300.0000 mg | ORAL_TABLET | Freq: Three times a day (TID) | ORAL | Status: DC
  Administered 2013-04-10 (×2): 300 mg via ORAL
  Filled 2013-04-10 (×4): qty 1

## 2013-04-10 MED ORDER — HYDRALAZINE HCL 20 MG/ML IJ SOLN
5.0000 mg | Freq: Once | INTRAMUSCULAR | Status: AC
  Administered 2013-04-10: 5 mg via INTRAVENOUS
  Filled 2013-04-10: qty 1

## 2013-04-10 MED ORDER — SODIUM CHLORIDE 0.9 % IJ SOLN
3.0000 mL | Freq: Two times a day (BID) | INTRAMUSCULAR | Status: DC
  Administered 2013-04-10 – 2013-04-12 (×4): 3 mL via INTRAVENOUS

## 2013-04-10 NOTE — Progress Notes (Signed)
Pt taken off the monitor after reassurring FHR  

## 2013-04-10 NOTE — Progress Notes (Signed)
Patient ID: Kayla Hill, female   DOB: [birth year], 42 y.o.   MRN: [id number] HD #1  S: No complaints. Good FM. No HA, no CP or SOB. No contractions. No epigastric pain. No visual symptoms.  O: Temp:  [97.4 F (36.3 C)-98.2 F (36.8 C)] 97.5 F (36.4 C) (11/13 0737) Pulse Rate:  [25-98] 73 (11/13 0856) Resp:  [18-20] 18 (11/13 0737) BP: (143-176)/(67-103) 148/86 mmHg (11/13 0856) SpO2:  [81 %-100 %] 99 % (11/13 0856) NCAT Neck: supple with FROM Lungs: CTA CV: RRR ABD: gravid , NT No CVAT EXT: no c/c/e DTRs 2+ , no clonus Neuro: nonfocal Skin: intact CBC    Component Value Date/Time   WBC 8.0 04/10/2013 0540   RBC 3.37* 04/10/2013 0540   HGB 10.4* 04/10/2013 0540   HCT 29.7* 04/10/2013 0540   PLT 139* 04/10/2013 0540   MCV 88.1 04/10/2013 0540   MCH 30.9 04/10/2013 0540   MCHC 35.0 04/10/2013 0540   RDW 13.5 04/10/2013 0540   LYMPHSABS 2.3 06/13/2012 1907   MONOABS 0.5 06/13/2012 1907   EOSABS 0.1 06/13/2012 1907   BASOSABS 0.0 06/13/2012 1907    Reactive NST, rare contractions  A: 34 2/7 week IUP-AGA and nl AFI CHTN with exacerbation-24 UTP nl, labs nl No s/s PEC AMA  P: Labetalol 300mg  tid DC Aldomet Monitor s/s  PEC Change to NST q shift DC home pending control of BP

## 2013-04-11 LAB — CBC
HCT: 32.6 % — ABNORMAL LOW (ref 36.0–46.0)
MCH: 30 pg (ref 26.0–34.0)
MCHC: 34.4 g/dL (ref 30.0–36.0)
MCV: 87.4 fL (ref 78.0–100.0)
RDW: 13.4 % (ref 11.5–15.5)

## 2013-04-11 LAB — CULTURE, BETA STREP (GROUP B ONLY)

## 2013-04-11 MED ORDER — HYDRALAZINE HCL 20 MG/ML IJ SOLN
10.0000 mg | Freq: Once | INTRAMUSCULAR | Status: AC
  Administered 2013-04-11: 10 mg via INTRAVENOUS
  Filled 2013-04-11: qty 1
  Filled 2013-04-11: qty 0.5

## 2013-04-11 MED ORDER — HYDRALAZINE HCL 10 MG PO TABS
10.0000 mg | ORAL_TABLET | Freq: Four times a day (QID) | ORAL | Status: DC
  Administered 2013-04-11 – 2013-04-12 (×6): 10 mg via ORAL
  Filled 2013-04-11 (×10): qty 1

## 2013-04-11 MED ORDER — POTASSIUM CHLORIDE CRYS ER 20 MEQ PO TBCR
40.0000 meq | EXTENDED_RELEASE_TABLET | Freq: Two times a day (BID) | ORAL | Status: DC
  Filled 2013-04-11: qty 2

## 2013-04-11 MED ORDER — LABETALOL HCL 300 MG PO TABS
300.0000 mg | ORAL_TABLET | Freq: Three times a day (TID) | ORAL | Status: DC
  Administered 2013-04-11 – 2013-04-12 (×4): 300 mg via ORAL
  Filled 2013-04-11 (×7): qty 1

## 2013-04-11 MED ORDER — POTASSIUM CHLORIDE CRYS ER 20 MEQ PO TBCR
40.0000 meq | EXTENDED_RELEASE_TABLET | Freq: Two times a day (BID) | ORAL | Status: DC
  Administered 2013-04-11 – 2013-04-12 (×3): 40 meq via ORAL
  Filled 2013-04-11 (×5): qty 2

## 2013-04-11 NOTE — Progress Notes (Signed)
Pt off the monitor after reassurring FHR  

## 2013-04-11 NOTE — Progress Notes (Signed)
S: 34 3/7 weeks Chronic HTN on labetalol/hydralazine Denies h/a, visual changes or epigastric pain  O: Afebrile  BP 162-184/81-99 BP now BP 145/84  Lungs clear to A Cor RRR Abd; gravid nontender Pelvic: deferred Ext trace edema DTR 1+  Tracing: baseline 140 (+) accel occ ctx  IMP: chronic HTN on labetalol/hydralazine Hypokalemia P) cont with HTN med. NST q shift . Kdur

## 2013-04-11 NOTE — Progress Notes (Signed)
TC to Dr Cherly Hensen to report that IV port leaking at time of apresoline injection and that pt may not have received prescribed dose.  BP continues to be elevated.  IV now patent and dressing changed.

## 2013-04-12 LAB — COMPREHENSIVE METABOLIC PANEL
ALT: 20 U/L (ref 0–35)
AST: 24 U/L (ref 0–37)
Albumin: 2.5 g/dL — ABNORMAL LOW (ref 3.5–5.2)
BUN: 8 mg/dL (ref 6–23)
CO2: 22 mEq/L (ref 19–32)
Calcium: 8.9 mg/dL (ref 8.4–10.5)
Creatinine, Ser: 0.53 mg/dL (ref 0.50–1.10)
GFR calc non Af Amer: >90 mL/min (ref >90)
Sodium: 131 mEq/L — ABNORMAL LOW (ref 135–145)
Total Bilirubin: 0.2 mg/dL — ABNORMAL LOW (ref 0.3–1.2)
Total Protein: 5.5 g/dL — ABNORMAL LOW (ref 6.0–8.3)

## 2013-04-12 LAB — CBC
HCT: 34.4 % — ABNORMAL LOW (ref 36.0–46.0)
MCH: 29.8 pg (ref 26.0–34.0)
MCHC: 33.4 g/dL (ref 30.0–36.0)
MCV: 89.1 fL (ref 78.0–100.0)
Platelets: 151 10*3/uL (ref 150–400)
RBC: 3.86 MIL/uL — ABNORMAL LOW (ref 3.87–5.11)
RDW: 13.8 % (ref 11.5–15.5)

## 2013-04-12 MED ORDER — LABETALOL HCL 300 MG PO TABS: 300.0000 mg | ORAL_TABLET | Freq: Three times a day (TID) | ORAL | Status: AC

## 2013-04-12 MED ORDER — HYDRALAZINE HCL 10 MG PO TABS: 10.0000 mg | ORAL_TABLET | Freq: Four times a day (QID) | ORAL | Status: DC

## 2013-04-12 MED ORDER — POTASSIUM CHLORIDE CRYS ER 20 MEQ PO TBCR: 20.0000 meq | EXTENDED_RELEASE_TABLET | Freq: Two times a day (BID) | ORAL | Status: DC

## 2013-04-12 NOTE — Progress Notes (Signed)
Pt. Stable and ready to be discharged home. All discharge instructions given and all questions answered. All belongings with patient. Patient wheeled out to parents.

## 2013-04-12 NOTE — Discharge Summary (Signed)
Patient ID: Kayla Hill MRN: [id number] DOB/AGE: [birth year] 42 y.o.  Admit date: 04/08/2013 Discharge date: 04/12/13  Admission Diagnoses: 34 WEEKS HBP  Discharge Diagnoses: 34 WEEKS HBP         Discharged Condition: stable  Hospital Course: Pt admitted for bp control and evaluation for pre-eclampsia. Over hospital stay had serial titration of bp meds to labetalol 300mg  q 8 hrs and hydralizine 10mg  po q6 hrs. Labs and exams remained negative for pre-eclampsia. Fetal testing remained reasurring with growth at 50%. Pt did have hypokalemia that corrected w/ oral K-dur.   Consults: None and MFM  Treatments: IV hydration and K-Dur, antihypertensives  Disposition: home     Medication List    STOP taking these medications       methyldopa 500 MG tablet  Commonly known as:  ALDOMET      TAKE these medications       aspirin EC 81 MG tablet  Take 81 mg by mouth daily.     folic acid 1 MG tablet  Commonly known as:  FOLVITE  Take 1 tablet by mouth daily.     hydrALAZINE 10 MG tablet  Commonly known as:  APRESOLINE  Take 1 tablet (10 mg total) by mouth every 6 (six) hours.     labetalol 300 MG tablet  Commonly known as:  NORMODYNE  Take 1 tablet (300 mg total) by mouth every 8 (eight) hours.     potassium chloride SA 20 MEQ tablet  Commonly known as:  K-DUR,KLOR-CON  Take 1 tablet (20 mEq total) by mouth 2 (two) times daily with a meal.     prenatal multivitamin Tabs tablet  Take 1 tablet by mouth daily.         Signed: Lendon Colonel., MD MD 04/12/2013, 11:29 PM

## 2013-04-12 NOTE — Progress Notes (Signed)
HD#5  S: Pt notes no HA, no vision change, no RUQ pain. Tol reg po. No N/V. Good FM, no abd pain, no ctx, no LOF, no ctx. Pt report much improvement to  lower extremity swelling.  OCeasar Mons Vitals:   04/12/13 0904 04/12/13 0932 04/12/13 1010 04/12/13 1211  BP:  144/80  134/80  Pulse:  67  75  Temp:    97.5 F (36.4 C)  TempSrc:    Oral  Resp: 18   18  Height:      Weight:   77.338 kg (170 lb 8 oz)   SpO2:       Gen: well appearing, no distress CV: RRR Pulm: CTAB Abd: soft, gravid, NT, no RUQ pain LE" no edema, 1+ DTR, no clonus GU: deferred  Repeat CBC and CMP pending  FH: 135's, + accels, no decels, 10 beat var Toco: occ ctx  A/P: 42 yo G3P0020 at 34'4 with h/o chronic htn and recent exacerbation. Bp's somewhat labile since admission however over past 24 hrs bp's under much better control. Pt on hydralizine and labetalol and has not needed any IV push meds in past 24 hrs. Labs and exam neg for PEC. If bp's and exam remain stable through today, will d/c home on current regimen of hydralizine and labetalol.  - FWB. reassuring testing. 50% growth on admission.   - Dispo. Likely home today w/ close outpt f/u.   Chrishawna Farina A. 04/12/2013 3:09 PM

## 2013-04-17 ENCOUNTER — Encounter (HOSPITAL_COMMUNITY): Payer: Self-pay | Admitting: *Deleted

## 2013-04-17 ENCOUNTER — Observation Stay (HOSPITAL_COMMUNITY)
Admission: AD | Admit: 2013-04-17 | Discharge: 2013-04-18 | DRG: 781 | Disposition: A | Discharge status: Home / Self Care | Payer: BC Managed Care – PPO | Source: Ambulatory Visit | Attending: Obstetrics & Gynecology | Admitting: Obstetrics & Gynecology

## 2013-04-17 DIAGNOSIS — O10913 Unspecified pre-existing hypertension complicating pregnancy, third trimester: Secondary | ICD-10-CM | POA: Diagnosis present

## 2013-04-17 DIAGNOSIS — O10019 Pre-existing essential hypertension complicating pregnancy, unspecified trimester: Principal | ICD-10-CM | POA: Diagnosis present

## 2013-04-17 DIAGNOSIS — O09529 Supervision of elderly multigravida, unspecified trimester: Secondary | ICD-10-CM | POA: Diagnosis present

## 2013-04-17 LAB — URINALYSIS, ROUTINE W REFLEX MICROSCOPIC
Bilirubin Urine: NEGATIVE
Glucose, UA: NEGATIVE mg/dL
Ketones, ur: NEGATIVE mg/dL
Protein, ur: NEGATIVE mg/dL
Specific Gravity, Urine: <1.005 — ABNORMAL LOW (ref 1.005–1.030)
pH: 6.5 (ref 5.0–8.0)

## 2013-04-17 LAB — COMPREHENSIVE METABOLIC PANEL
ALT: 15 U/L (ref 0–35)
Albumin: 2.7 g/dL — ABNORMAL LOW (ref 3.5–5.2)
Alkaline Phosphatase: 116 U/L (ref 39–117)
BUN: 6 mg/dL (ref 6–23)
CO2: 21 mEq/L (ref 19–32)
Chloride: 100 mEq/L (ref 96–112)
Potassium: 3.6 mEq/L (ref 3.5–5.1)
Sodium: 131 mEq/L — ABNORMAL LOW (ref 135–145)
Total Bilirubin: 0.3 mg/dL (ref 0.3–1.2)
Total Protein: 6 g/dL (ref 6.0–8.3)

## 2013-04-17 LAB — CBC
HCT: 34.8 % — ABNORMAL LOW (ref 36.0–46.0)
Hemoglobin: 11.9 g/dL — ABNORMAL LOW (ref 12.0–15.0)
MCH: 30.2 pg (ref 26.0–34.0)
MCHC: 34.2 g/dL (ref 30.0–36.0)
MCV: 88.3 fL (ref 78.0–100.0)
RDW: 13.6 % (ref 11.5–15.5)

## 2013-04-17 LAB — URINE MICROSCOPIC-ADD ON

## 2013-04-17 LAB — PROTEIN / CREATININE RATIO, URINE
Creatinine, Urine: 27.65 mg/dL
Protein Creatinine Ratio: 0.16 — ABNORMAL HIGH (ref 0.00–0.15)

## 2013-04-17 LAB — LACTATE DEHYDROGENASE: LDH: 260 U/L — ABNORMAL HIGH (ref 94–250)

## 2013-04-17 LAB — URIC ACID: Uric Acid, Serum: 4.5 mg/dL (ref 2.4–7.0)

## 2013-04-17 MED ORDER — PRENATAL MULTIVITAMIN CH
1.0000 | ORAL_TABLET | Freq: Every day | ORAL | Status: DC
  Administered 2013-04-18: 1 via ORAL
  Filled 2013-04-17: qty 1

## 2013-04-17 MED ORDER — POTASSIUM CHLORIDE CRYS ER 20 MEQ PO TBCR
20.0000 meq | EXTENDED_RELEASE_TABLET | Freq: Two times a day (BID) | ORAL | Status: DC
  Administered 2013-04-18: 20 meq via ORAL
  Filled 2013-04-17: qty 1

## 2013-04-17 MED ORDER — LABETALOL HCL 100 MG PO TABS
100.0000 mg | ORAL_TABLET | Freq: Once | ORAL | Status: AC
  Administered 2013-04-17: 100 mg via ORAL
  Filled 2013-04-17: qty 1

## 2013-04-17 MED ORDER — DOCUSATE SODIUM 100 MG PO CAPS
100.0000 mg | ORAL_CAPSULE | Freq: Every day | ORAL | Status: DC
  Administered 2013-04-18: 100 mg via ORAL
  Filled 2013-04-17: qty 1

## 2013-04-17 MED ORDER — HYDRALAZINE HCL 10 MG PO TABS
10.0000 mg | ORAL_TABLET | Freq: Four times a day (QID) | ORAL | Status: DC
  Administered 2013-04-18 (×2): 10 mg via ORAL
  Filled 2013-04-17 (×3): qty 1

## 2013-04-17 MED ORDER — LABETALOL HCL 300 MG PO TABS
300.0000 mg | ORAL_TABLET | Freq: Three times a day (TID) | ORAL | Status: DC
  Administered 2013-04-18 (×2): 300 mg via ORAL
  Filled 2013-04-17 (×2): qty 1

## 2013-04-17 MED ORDER — CALCIUM CARBONATE ANTACID 500 MG PO CHEW: 2.0000 | CHEWABLE_TABLET | ORAL | Status: DC | PRN

## 2013-04-17 MED ORDER — ASPIRIN 325 MG PO TABS: 325.0000 mg | ORAL_TABLET | Freq: Every day | ORAL | Status: DC

## 2013-04-17 MED ORDER — LABETALOL HCL 300 MG PO TABS
300.0000 mg | ORAL_TABLET | Freq: Three times a day (TID) | ORAL | Status: DC
  Filled 2013-04-17 (×3): qty 1

## 2013-04-17 MED ORDER — ASPIRIN EC 81 MG PO TBEC
81.0000 mg | DELAYED_RELEASE_TABLET | Freq: Every day | ORAL | Status: DC
  Administered 2013-04-18: 81 mg via ORAL
  Filled 2013-04-17: qty 1

## 2013-04-17 MED ORDER — ZOLPIDEM TARTRATE 5 MG PO TABS: 5.0000 mg | ORAL_TABLET | Freq: Every evening | ORAL | Status: DC | PRN

## 2013-04-17 MED ORDER — ACETAMINOPHEN 325 MG PO TABS: 650.0000 mg | ORAL_TABLET | Freq: Four times a day (QID) | ORAL | Status: DC | PRN

## 2013-04-17 MED ORDER — FOLIC ACID 1 MG PO TABS
1.0000 mg | ORAL_TABLET | Freq: Every day | ORAL | Status: DC
  Administered 2013-04-18: 1 mg via ORAL
  Filled 2013-04-17: qty 1

## 2013-04-17 NOTE — H&P (Signed)
Kayla Hill is a 42 y.o. female admitted from MAU for BP control. She had office visit today and had BP of 160/105 and LE swelling getting worse and urine dip with protein+2.  Patient is 42 yo, G4P0030 at 35.2 wks, with CHTN on Labetalol (300mg  bid) and Hydralazine (10mg  qid) since discharged home after last inpatient admission for worsening CHTN, was discharged home on 04/12/13. She was doing well, home BPs were mostly 130-140/80s.  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 She denies any complaints of headache/ chest pain/ pressure/ breathing difficulty. She denies contractions/ pain or vaginal bleeding. She reports good FMs.   [facility], primary Ob Dr Seymour Bars. Started care at 8-9 wks. She is AMA, h/o CHTN. HTN noted since 1st trim, was started on Aldomet first, Labetalol was added from 16 wks. No GDM. Last growth sono in office 11/17- 5'4" Vtx.    OB History    Grav  Para  Term  Preterm  Abortions  TAB  SAB  Ect  Mult  Living    4  0  0  0  3  1  2   0  0  0      Past Medical History   Diagnosis  Date   .  Migraines    .  Hypertension     Past Surgical History   Procedure  Laterality  Date   .  Nasal fracture surgery      Family History: family history includes Cancer in her maternal aunt; Hypertension in her father, maternal aunt, and mother.  Social History: reports that she has never smoked. She does not have any smokeless tobacco history on file. She reports that she does not drink alcohol or use illicit drugs.   Prenatal Transfer Tool   Maternal Diabetes: No  Genetic Screening: Declined but AFP1 negative  Maternal Ultrasounds/Referrals: Normal except echogenic bowel  Fetal Ultrasounds or other Referrals: none  Maternal Substance Abuse: No  Significant Maternal Medications: Meds include: Other: Labetalol, Hydralazine  Significant Maternal Lab Results: PIH labs normal  Other Comments:  Flu vaccine and TDap given   Review of Systems  Constitutional: Negative for fever.  Eyes: Negative for blurred vision.  Respiratory: Negative for shortness of breath.  Cardiovascular: Positive for leg swelling. Negative for chest pain.  Gastrointestinal: Negative for nausea, vomiting and abdominal pain.  Musculoskeletal: Negative for myalgias.  Neurological: Negative for dizziness and headaches.  Psychiatric/Behavioral: Negative for depression.    BP 166/90  Pulse 68  Temp(Src) 97.9 F (36.6 C)  Resp 18  Ht 5\' 8"  (1.727 m)  Wt 174 lb 12.8 oz (79.289 kg)  BMI 26.58 kg/m2  LMP 08/13/2012 In MAU BP ranges from 140s- 166/ 70s- 86. First triage BP was 171/101.  Physical Exam  A&O x 3, no acute distress. Pleasant  HEENT neg  Lungs CTA bilat  CV RRR, S1S2 normal  Abdo soft, non tender, non acute, Non tender gravid uterus  Extr no edema/ tenderness, +1 ankle swelling. DTR +3/+3  Pelvic deferred  FHT 140s/ + accels/ no decels/ moderate variability- category I - NST reactive Toco none   Prenatal labs:  ABO, Rh: O(+)  Antibody: neg  Rubella: Immune  RPR: Neg  HBsAg: neg  HIV: neg  GBS: not done - will send from hospital  Glucola: 1 hr Abn but passed 3 hr GTT  Flu vaccine and  TDap given   Assessment/Plan:  42 yo, G4P0030 at 35.2 wks with CHTN in pregnancy, with worsening BPs vs superimposed Preeclampsia in light of worsening swelling in LE and urine dip with +2 protein in office.  PIH Labs normal Send Urine P/C ration now and starting 24 hr protein collection.   Plan BP control, add one additional Labetalol 100mg  PO now, continuous EFM.  Prematurity- reviewed fetal risks of prematurity vs HTN affecting fetus including risk of abruption. GBS(-) from last admission.  Reviewed possible IOL if BP not well controlled with increased medication (she had room to increase antiHTN meds).   V.Jamesen Stahnke, MD

## 2013-04-17 NOTE — MAU Note (Signed)
Pt reports she was at the doctors office today and b/p was elevated. She was sent for evaluation. Pt denie h/a or pain and good fetal movement reported.

## 2013-04-17 NOTE — MAU Note (Signed)
Patient sent from office for evaluation after elevated BP documented. States she has since taken her prescribed labetalol.

## 2013-04-18 ENCOUNTER — Inpatient Hospital Stay (HOSPITAL_COMMUNITY)
Admission: AD | Admit: 2013-04-18 | Discharge: 2013-04-19 | Disposition: A | Discharge status: Home / Self Care | Payer: BC Managed Care – PPO | Source: Ambulatory Visit | Attending: Obstetrics and Gynecology | Admitting: Obstetrics and Gynecology

## 2013-04-18 ENCOUNTER — Encounter (HOSPITAL_COMMUNITY): Payer: Self-pay | Admitting: *Deleted

## 2013-04-18 DIAGNOSIS — O10019 Pre-existing essential hypertension complicating pregnancy, unspecified trimester: Secondary | ICD-10-CM | POA: Insufficient documentation

## 2013-04-18 NOTE — Discharge Summary (Signed)
Physician Discharge Summary  Patient ID: Kayla Hill MRN: [id number] DOB/AGE: [birth year] 42 y.o.  Admit date: 04/17/2013 Discharge date: 04/18/2013  Admission Diagnoses: 35.2 wks, CHTN with exacerbation in pregnancy  Discharge Diagnoses:  Same   Discharged Condition: BP improved. NST reactive.   Hospital Course:  Patient was admitted from MAU with BP 171/101 and 150/98. She had one dose of PO Labetalol 100 mg and her scheduled medications were continued with Labetalol 300 mg TID and Hydralazine 10 mg QID. Overnight BPs improved. Lower extremity swelling reduced. PIH labs were normal, urine protein/creatinine ratio normal 0.13. NST reactive. Patient was discharged home but will bring back 24 hr urine back and repeat BP in MAU.    Discharge Exam: Blood pressure 132/75, pulse 75, temperature 97.7 F (36.5 C), temperature source Oral, resp. rate 18, height 5\' 8"  (1.727 m), weight 174 lb 8 oz (79.153 kg), last menstrual period 08/13/2012. General appearance: alert and cooperative, NAD. Lungs CTA bilateral, CV RRR, S1, S2 normal, Gravid uterus, relaxed. Extremities no swelling, DTR+1/+1  NST reactive.   Disposition: 01-Home or Self Care  Discharge Orders   Future Orders Complete By Expires   Bed rest  As directed    Call MD for:  difficulty breathing, headache or visual disturbances  As directed    Call MD for:  extreme fatigue  As directed    Call MD for:  hives  As directed    Call MD for:  persistant dizziness or light-headedness  As directed    Call MD for:  persistant nausea and vomiting  As directed    Call MD for:  severe uncontrolled pain  As directed    Call MD for:  temperature >100.4  As directed    Call MD for:  As directed    Comments:     For BP>150/90   Diet - low sodium heart healthy  As directed    Discharge instructions  As directed    Comments:     Return to MAU tonight to drop off 24 hr urine to MAU and bring in your home BP machine to confirm readings.   Driving Restrictions  As directed    Comments:     Ok to drive if BP <562/13   May shower / Bathe  As directed    Sexual Activity Restrictions  As directed    Comments:     2 wks       Medication List         aspirin EC 81 MG tablet  Take 81 mg by mouth daily.     calcium carbonate 500 MG chewable tablet  Commonly known as:  TUMS - dosed in mg elemental calcium  Chew 1 tablet by mouth 2 (two) times daily as needed for indigestion or heartburn.     folic acid 1 MG tablet  Commonly known as:  FOLVITE  Take 1 tablet by mouth daily.     hydrALAZINE 10 MG tablet  Commonly known as:  APRESOLINE  Take 1 tablet (10 mg total) by mouth every 6 (six) hours.     labetalol 300 MG tablet  Commonly known as:  NORMODYNE  Take 1 tablet (300 mg total) by mouth every 8 (eight) hours.     potassium chloride SA 20 MEQ tablet  Commonly known as:  K-DUR,KLOR-CON  Take 1 tablet (20 mEq total) by mouth 2 (two) times daily with a meal.     prenatal multivitamin Tabs tablet  Take 1 tablet  by mouth daily.         Signed: Kawehi Hostetter R 04/18/2013, 11:20 AM

## 2013-04-18 NOTE — Progress Notes (Signed)
Pt. Is stable and ready to be discharged. All discharge instructions reviewed and all questions answered. Pt. Has urine to take home and understands when she needs to come back to bring her urine and what she needs to bring with it. Pt. Wheeled out via wheelchair to her car. Pt. Able to drive home herself based off the blood pressure parameters given by the doctors.

## 2013-04-18 NOTE — Progress Notes (Signed)
THE [facility] OF [facility]  ANTENATAL UNIT [id number] [place] [address] Phone: [phone] Fax: [phone]  April 18, 2013  Patient: Kayla Hill  Date of Birth: 1971-04-13  Date of Visit: 04/17/13-04/18/13    To Whom It May Concern:  Kayla Hill was seen and treated in our Antenatal Unit from 04/17/13 to 04/18/13. Please excuse her from work on Friday, 04/18/13 as she is being discharged from the hospital on bedrest.   Sincerely,   Milinda Cave, RN Dr. Juliene Pina

## 2013-04-18 NOTE — Progress Notes (Signed)
Patient ID: Kayla Hill, female   DOB: June 27, 1970, 42 y.o.   MRN: [id number] Subjective:  Feels well. Leg swelling reduced. No HA/vision changes/ SOB/ CP/ pressure. GOod FMs. No vag bleeding or leaking or UCs.    Objective:  Physical exam:  A&O x 3, no acute distress. Pleasant HEENT neg, no thyromegaly Lungs CTA bilat CV RRR, S1S2 normal Abdo soft, non tender, non acute Extr no edema/ tenderness, edema reduced, DTR +1/+1 (improved)  Pelvic deferred FHT  140s/ + accels/ no decels/ moderate variability- reactive NST Toco no UCs.   Assessment/Plan:  42 yo, with CHTN, worsening HTN. Stable BPs since admission and bedrest. Labs from MAU and Urine P/C ratio normal. Ok to discharge home on bedrest..  Patient will continue 24 hr urine for protein/ creatinine and drop off tonight. She will bring in her home BP machine to compare it with hospital reading when she comes to MAU tonight to drop off urine. Continue home BP recording 3times per and call back if >150/95. Agrees. Needs to be on bedrest and will be taken off work. Continue Labetalol 300mg  Labetalol TID  And Hydralazine 10mg  QID.  F/up in office 11/24 with Dr Seymour Bars and will add BPP/AFI.  Reviewed D/c instructions. V.Tryphena Perkovich, MD

## 2013-04-18 NOTE — MAU Note (Signed)
Pt here to bring in 24hr urine and for b/p check . 24hour urine to lab. No complaints.

## 2013-04-19 NOTE — Progress Notes (Signed)
Subjective: Patient reports no complaints. Feels well. Good FMs. No contractions/ bleeding/ leaking. Denies HA/ vision changes/ RUQ pain/ CP/ SOB.  BP range stable. VS normal.   Objective: I have reviewed patient's vital signs, intake and output, medications and labs General: alert and cooperative Resp: clear to auscultation bilaterally Cardio: regular rate and rhythm, S1, S2 normal, no murmur, click, rub or gallop GI: soft, non-tender; bowel sounds normal; no masses,  no organomegaly Extremities: extremities normal, atraumatic, no cyanosis or edema DTR +1/+1  Assessment/Plan: 42 yo, G1 at 35.3 wks with stable CHTN. Discharge home. Complete 24 hr urine, bring back this evening with home BP machine to compare. PEC warning s/s and reportable symptoms of worsening BP reviewed, check home BPs and report >150/90 Office visit 11/24 with Dr Seymour Bars with office BPP/AFI.   Neiko Trivedi R 04/19/2013, 2:38 PM

## 2013-04-19 NOTE — Progress Notes (Signed)
Written and verbal d/c instructions given and understanding voiced. Computer not working so pt could not sign e signature.

## 2013-04-19 NOTE — Progress Notes (Signed)
Patient ID: Kayla Hill, female   DOB: Jun 21, 1970, 42 y.o.   MRN: [id number] Subjective:  Feels well. Leg swelling reduced. No HA/vision changes/ SOB/ CP/ pressure. GOod FMs. No vag bleeding or leaking or UCs.  Presents for fu BP ck.    Objective: BP 144/98  Temp(Src) 98 F (36.7 C)  LMP 08/13/2012    Physical exam:  A&O x 3, no acute distress. Pleasant HEENT neg, no thyromegaly Lungs CTA bilat CV RRR, S1S2 normal Abdo soft, non tender, non acute Extr no edema/ tenderness, edema reduced, DTR +1/+1 (improved)  Pelvic deferred  FHT  140s/ + accels/ no decels/ moderate variability- reactive NST Toco no UCs.   Assessment/Plan:  42 yo, with CHTN, worsening HTN. Stable BPs. Ok to discharge home on bedrest..  Patient will Continue home BP recording 3times per and call back if >150/95.  Needs to be on bedrest and will be taken off work. Continue Labetalol 300mg  Labetalol TID  And Hydralazine 10mg  QID.  F/up in office 11/24 with Dr Seymour Bars and will add BPP/AFI.  Reviewed D/c instructions. PEC precautions

## 2013-04-19 NOTE — MAU Provider Note (Signed)
  History   Hist  CSN: [id number]  Arrival date and time: 04/18/13 2204   First Provider Initiated Contact with Patient 04/19/13 0015      Chief Complaint  Patient presents with  . Blood Pressure Check   HPI  OB History   Grav Para Term Preterm Abortions TAB SAB Ect Mult Living   4 0 0 0 3 1 2 0 0 0       Past Medical History  Diagnosis Date  . Migraines   . Hypertension   . Chronic hypertension with exacerbation during pregnancy in third trimester 04/09/2013    Past Surgical History  Procedure Laterality Date  . Nasal fracture surgery      Family History  Problem Relation Age of Onset  . Hypertension Mother   . Hypertension Father   . Cancer Maternal Aunt   . Hypertension Maternal Aunt     History  Substance Use Topics  . Smoking status: Never Smoker   . Smokeless tobacco: Never Used  . Alcohol Use: No    Allergies: No Known Allergies  Prescriptions prior to admission  Medication Sig Dispense Refill  . aspirin EC 81 MG tablet Take 81 mg by mouth daily.      . calcium carbonate (TUMS - DOSED IN MG ELEMENTAL CALCIUM) 500 MG chewable tablet Chew 1 tablet by mouth 2 (two) times daily as needed for indigestion or heartburn.      . folic acid (FOLVITE) 1 MG tablet Take 1 tablet by mouth daily.      . hydrALAZINE (APRESOLINE) 10 MG tablet Take 1 tablet (10 mg total) by mouth every 6 (six) hours.  120 tablet  3  . labetalol (NORMODYNE) 300 MG tablet Take 1 tablet (300 mg total) by mouth every 8 (eight) hours.  90 tablet  3  . potassium chloride SA (K-DUR,KLOR-CON) 20 MEQ tablet Take 1 tablet (20 mEq total) by mouth 2 (two) times daily with a meal.  60 tablet  1  . Prenatal Vit-Fe Fumarate-FA (PRENATAL MULTIVITAMIN) TABS Take 1 tablet by mouth daily.        ROS Physical Exam   Blood pressure 144/98, temperature 98 F (36.7 C), last menstrual period 08/13/2012.  Physical Exam  MAU Course  Procedures  MDM na  Assessment and Plan  See other  note  Ahyan Kreeger J 04/19/2013, 12:17 AM

## 2013-05-01 ENCOUNTER — Telehealth (HOSPITAL_COMMUNITY): Payer: Self-pay | Admitting: *Deleted

## 2013-05-01 NOTE — Telephone Encounter (Signed)
Preadmission screen  

## 2013-05-02 ENCOUNTER — Other Ambulatory Visit: Payer: Self-pay | Admitting: Obstetrics & Gynecology

## 2013-05-08 ENCOUNTER — Inpatient Hospital Stay (HOSPITAL_COMMUNITY)
Admission: RE | Admit: 2013-05-08 | Discharge: 2013-05-12 | DRG: 765 | Disposition: A | Discharge status: Home / Self Care | Payer: BC Managed Care – PPO | Source: Ambulatory Visit | Attending: Obstetrics & Gynecology | Admitting: Obstetrics & Gynecology

## 2013-05-08 ENCOUNTER — Inpatient Hospital Stay (HOSPITAL_COMMUNITY): Payer: BC Managed Care – PPO | Admitting: Anesthesiology

## 2013-05-08 ENCOUNTER — Encounter (HOSPITAL_COMMUNITY): Payer: BC Managed Care – PPO | Admitting: Anesthesiology

## 2013-05-08 ENCOUNTER — Encounter (HOSPITAL_COMMUNITY): Payer: Self-pay

## 2013-05-08 VITALS — BP 172/104 | HR 71 | Temp 98.0°F | Resp 20 | Ht 68.0 in | Wt 178.0 lb

## 2013-05-08 DIAGNOSIS — O10919 Unspecified pre-existing hypertension complicating pregnancy, unspecified trimester: Secondary | ICD-10-CM

## 2013-05-08 DIAGNOSIS — O1002 Pre-existing essential hypertension complicating childbirth: Principal | ICD-10-CM | POA: Diagnosis present

## 2013-05-08 DIAGNOSIS — O10913 Unspecified pre-existing hypertension complicating pregnancy, third trimester: Secondary | ICD-10-CM

## 2013-05-08 DIAGNOSIS — O09529 Supervision of elderly multigravida, unspecified trimester: Secondary | ICD-10-CM | POA: Diagnosis present

## 2013-05-08 DIAGNOSIS — Z9889 Other specified postprocedural states: Secondary | ICD-10-CM

## 2013-05-08 DIAGNOSIS — O324XX0 Maternal care for high head at term, not applicable or unspecified: Secondary | ICD-10-CM | POA: Diagnosis not present

## 2013-05-08 DIAGNOSIS — D62 Acute posthemorrhagic anemia: Secondary | ICD-10-CM | POA: Diagnosis not present

## 2013-05-08 DIAGNOSIS — O9903 Anemia complicating the puerperium: Secondary | ICD-10-CM | POA: Diagnosis not present

## 2013-05-08 HISTORY — DX: Unspecified pre-existing hypertension complicating pregnancy, unspecified trimester: O10.919

## 2013-05-08 LAB — CBC
HCT: 36.1 % (ref 36.0–46.0)
HCT: 36.2 % (ref 36.0–46.0)
Hemoglobin: 12.4 g/dL (ref 12.0–15.0)
Hemoglobin: 12.3 g/dL (ref 12.0–15.0)
MCH: 30.5 pg (ref 26.0–34.0)
MCH: 30 pg (ref 26.0–34.0)
MCHC: 34.3 g/dL (ref 30.0–36.0)
MCHC: 34 g/dL (ref 30.0–36.0)
MCV: 88.7 fL (ref 78.0–100.0)
RDW: 13.3 % (ref 11.5–15.5)

## 2013-05-08 LAB — COMPREHENSIVE METABOLIC PANEL
ALT: 14 U/L (ref 0–35)
AST: 22 U/L (ref 0–37)
Albumin: 2.8 g/dL — ABNORMAL LOW (ref 3.5–5.2)
Alkaline Phosphatase: 132 U/L — ABNORMAL HIGH (ref 39–117)
BUN: 8 mg/dL (ref 6–23)
CO2: 21 mEq/L (ref 19–32)
Calcium: 9.5 mg/dL (ref 8.4–10.5)
Chloride: 98 mEq/L (ref 96–112)
Creatinine, Ser: 0.63 mg/dL (ref 0.50–1.10)
GFR calc Af Amer: >90 mL/min (ref >90)
GFR calc non Af Amer: >90 mL/min (ref >90)
Glucose, Bld: 80 mg/dL (ref 70–99)
Potassium: 4 mEq/L (ref 3.5–5.1)
Sodium: 130 mEq/L — ABNORMAL LOW (ref 135–145)
Total Bilirubin: 0.3 mg/dL (ref 0.3–1.2)
Total Protein: 6.2 g/dL (ref 6.0–8.3)

## 2013-05-08 LAB — URINALYSIS, ROUTINE W REFLEX MICROSCOPIC
Bilirubin Urine: NEGATIVE
Protein, ur: NEGATIVE mg/dL
Urobilinogen, UA: 0.2 mg/dL (ref 0.0–1.0)

## 2013-05-08 LAB — URINE MICROSCOPIC-ADD ON

## 2013-05-08 MED ORDER — ONDANSETRON HCL 4 MG/2ML IJ SOLN: 4.0000 mg | Freq: Four times a day (QID) | INTRAMUSCULAR | Status: DC | PRN

## 2013-05-08 MED ORDER — DIPHENHYDRAMINE HCL 50 MG/ML IJ SOLN: 12.5000 mg | INTRAMUSCULAR | Status: DC | PRN

## 2013-05-08 MED ORDER — ACETAMINOPHEN 325 MG PO TABS: 650.0000 mg | ORAL_TABLET | ORAL | Status: DC | PRN

## 2013-05-08 MED ORDER — TERBUTALINE SULFATE 1 MG/ML IJ SOLN: 0.2500 mg | Freq: Once | INTRAMUSCULAR | Status: AC | PRN

## 2013-05-08 MED ORDER — PHENYLEPHRINE 40 MCG/ML (10ML) SYRINGE FOR IV PUSH (FOR BLOOD PRESSURE SUPPORT): 80.0000 ug | PREFILLED_SYRINGE | INTRAVENOUS | Status: DC | PRN

## 2013-05-08 MED ORDER — IBUPROFEN 600 MG PO TABS: 600.0000 mg | ORAL_TABLET | Freq: Four times a day (QID) | ORAL | Status: DC | PRN

## 2013-05-08 MED ORDER — HYDRALAZINE HCL 20 MG/ML IJ SOLN
10.0000 mg | Freq: Once | INTRAMUSCULAR | Status: AC
  Administered 2013-05-08: 10 mg via INTRAVENOUS
  Filled 2013-05-08: qty 1

## 2013-05-08 MED ORDER — FENTANYL 2.5 MCG/ML BUPIVACAINE 1/10 % EPIDURAL INFUSION (WH - ANES)
14.0000 mL/h | INTRAMUSCULAR | Status: DC | PRN
  Administered 2013-05-08: 14 mL/h via EPIDURAL
  Filled 2013-05-08: qty 125

## 2013-05-08 MED ORDER — PHENYLEPHRINE 40 MCG/ML (10ML) SYRINGE FOR IV PUSH (FOR BLOOD PRESSURE SUPPORT)
80.0000 ug | PREFILLED_SYRINGE | INTRAVENOUS | Status: DC | PRN
  Filled 2013-05-08: qty 10

## 2013-05-08 MED ORDER — OXYTOCIN 40 UNITS IN LACTATED RINGERS INFUSION - SIMPLE MED: 1.0000 m[IU]/min | INTRAVENOUS | Status: DC

## 2013-05-08 MED ORDER — LABETALOL HCL 300 MG PO TABS
300.0000 mg | ORAL_TABLET | Freq: Three times a day (TID) | ORAL | Status: DC
  Administered 2013-05-08 (×2): 300 mg via ORAL
  Filled 2013-05-08 (×5): qty 1

## 2013-05-08 MED ORDER — HYDRALAZINE HCL 20 MG/ML IJ SOLN: 10.0000 mg | Freq: Four times a day (QID) | INTRAMUSCULAR | Status: DC

## 2013-05-08 MED ORDER — OXYTOCIN 40 UNITS IN LACTATED RINGERS INFUSION - SIMPLE MED
1.0000 m[IU]/min | INTRAVENOUS | Status: DC
  Administered 2013-05-08: 2 m[IU]/min via INTRAVENOUS
  Filled 2013-05-08: qty 1000

## 2013-05-08 MED ORDER — OXYTOCIN 40 UNITS IN LACTATED RINGERS INFUSION - SIMPLE MED: 62.5000 mL/h | INTRAVENOUS | Status: DC

## 2013-05-08 MED ORDER — EPHEDRINE 5 MG/ML INJ: 10.0000 mg | INTRAVENOUS | Status: DC | PRN

## 2013-05-08 MED ORDER — LACTATED RINGERS IV SOLN: 500.0000 mL | INTRAVENOUS | Status: DC | PRN

## 2013-05-08 MED ORDER — LACTATED RINGERS IV SOLN: 500.0000 mL | Freq: Once | INTRAVENOUS | Status: DC

## 2013-05-08 MED ORDER — EPHEDRINE 5 MG/ML INJ
10.0000 mg | INTRAVENOUS | Status: DC | PRN
  Filled 2013-05-08: qty 4

## 2013-05-08 MED ORDER — LACTATED RINGERS IV SOLN
INTRAVENOUS | Status: DC
  Administered 2013-05-08: 125 mL/h via INTRAVENOUS
  Administered 2013-05-08 – 2013-05-09 (×2): via INTRAVENOUS

## 2013-05-08 MED ORDER — OXYTOCIN BOLUS FROM INFUSION: 500.0000 mL | INTRAVENOUS | Status: DC

## 2013-05-08 MED ORDER — CITRIC ACID-SODIUM CITRATE 334-500 MG/5ML PO SOLN
30.0000 mL | ORAL | Status: DC | PRN
  Administered 2013-05-09: 30 mL via ORAL
  Filled 2013-05-08: qty 15

## 2013-05-08 MED ORDER — HYDRALAZINE HCL 10 MG PO TABS
10.0000 mg | ORAL_TABLET | Freq: Four times a day (QID) | ORAL | Status: DC
  Administered 2013-05-08 – 2013-05-09 (×2): 10 mg via ORAL
  Filled 2013-05-08 (×7): qty 1

## 2013-05-08 MED ORDER — OXYCODONE-ACETAMINOPHEN 5-325 MG PO TABS: 1.0000 | ORAL_TABLET | ORAL | Status: DC | PRN

## 2013-05-08 MED ORDER — LIDOCAINE HCL (PF) 1 % IJ SOLN
INTRAMUSCULAR | Status: DC | PRN
  Administered 2013-05-08 (×4): 4 mL

## 2013-05-08 MED ORDER — LIDOCAINE HCL (PF) 1 % IJ SOLN: 30.0000 mL | INTRAMUSCULAR | Status: DC | PRN

## 2013-05-08 NOTE — Anesthesia Procedure Notes (Signed)
Epidural Patient location during procedure: OB Start time: 05/08/2013 7:44 PM  Staffing Performed by: anesthesiologist   Preanesthetic Checklist Completed: patient identified, site marked, surgical consent, pre-op evaluation, timeout performed, IV checked, risks and benefits discussed and monitors and equipment checked  Epidural Patient position: sitting Prep: site prepped and draped and DuraPrep Patient monitoring: continuous pulse ox and blood pressure Approach: midline Injection technique: LOR air  Needle:  Needle type: Tuohy  Needle gauge: 17 G Needle length: 9 cm and 9 Needle insertion depth: 6 cm Catheter type: closed end flexible Catheter size: 19 Gauge Catheter at skin depth: 11 cm Test dose: negative  Assessment Events: blood not aspirated, injection not painful, no injection resistance, negative IV test and no paresthesia  Additional Notes Discussed risk of headache, infection, bleeding, nerve injury and failed or incomplete block.  Patient voices understanding and wishes to proceed.  Epidural placed easily on first attempt.  No paresthesia.  Patient tolerated procedure well with no apparent complications.  Jasmine December, MDReason for block:procedure for pain

## 2013-05-08 NOTE — Progress Notes (Signed)
Beacon calibrated and applied to pt

## 2013-05-08 NOTE — Progress Notes (Signed)
Beacon re-calibrated and applied to pt

## 2013-05-08 NOTE — Progress Notes (Signed)
Subjective: Doing well, pain mild-mod, UCs 3 min  Anesthesia none   Objective: BP 149/90  Pulse 63  Temp(Src) 97.6 F (36.4 C) (Oral)  Resp 16  Ht 5\' 8"  (1.727 m)  Wt 80.74 kg (178 lb)  BMI 27.07 kg/m2  LMP 08/13/2012   FHT:  FHR: 140's bpm, variability: moderate,  accelerations:  Present,  decelerations:  Absent UC:   regular, every 3 minutes VE:   Dilation: 3 Effacement (%): 80 Station: -2 Exam by:: Dr Renato Shin AF.  IUPC inserted without difficulty.   Assessment / Plan: Induction of labor due to cHTN/PIH,  progressing well on pitocin  Fetal Wellbeing:  Category I Pain Control:  Labor support without medications  Anticipated MOD:  NSVD  Kayla Hill,Kayla Hill 05/08/2013, 3:42 PM

## 2013-05-08 NOTE — Progress Notes (Addendum)
Kayla Hill is a 42 y.o. G4P0030 at [redacted]w[redacted]d admitted for IOL for [facility] in pregnancy. Denies CP/HA/SOB/ vision changes/ Swelling. Good FMs, no bleeding/ abdo pain/ leaking fluid.   Objective: BP 158/74  Pulse 64  Temp(Src) 98.2 F (36.8 C) (Oral)  Resp 16  Ht 5\' 8"  (1.727 m)  Wt 178 lb (80.74 kg)  BMI 27.07 kg/m2  LMP 08/13/2012   Physical exam:  A&O x 3, no acute distress. Pleasant HEENT neg Lungs CTA bilat CV RRR, S1S2 normal Abdo soft, non tender, non acute, gravid uterus, relaxed b/w contractions.  Extr no edema/ tenderness, DTR +1+1  FHT:  FHR: 140 bpm, variability: moderate,  accelerations:  Present,  decelerations:  Absent category I UC:   irregular, every 5-6 minutes SVE:   Dilation: 1 Effacement (%): 50 Station: -2 Exam by:: Dr Juliene Pina AROM, small amount of clear fluid  Labs: Lab Results  Component Value Date   WBC 9.1 05/08/2013   HGB 12.4 05/08/2013   HCT 36.1 05/08/2013   MCV 88.7 05/08/2013   PLT 148* 05/08/2013  CMP normal Urine - protein (-)  Assessment / Plan: Induction of labor due to [facility] medical conditions,  progressing well on pitocin Turn in exaggerated Sims. BP parameters for call for IV meds reviewed with RN (call for >160/100 after repeating x 1) Labor: Progressing normally Preeclampsia:  no signs or symptoms of toxicity, no preeclampsia Fetal Wellbeing:  Category I Pain Control:  Labor support without medications. Med/ epidural pro-cons reviewed, pt to decide I/D:  n/a Anticipated MOD:  NSVD, EFW 6.1/2 lbs  Hagen Tidd R 05/08/2013, 11:12 AM

## 2013-05-08 NOTE — Progress Notes (Signed)
Pt just returned to bed from bathroom  

## 2013-05-08 NOTE — Anesthesia Preprocedure Evaluation (Signed)
Anesthesia Evaluation  Patient identified by MRN, date of birth, ID band Patient awake    Reviewed: Allergy & Precautions, H&P , NPO status , Patient's Chart, lab work & pertinent test results, reviewed documented beta blocker date and time   History of Anesthesia Complications Negative for: history of anesthetic complications  Airway Mallampati: III TM Distance: >3 FB Neck ROM: full    Dental  (+) Teeth Intact   Pulmonary neg pulmonary ROS,  breath sounds clear to auscultation        Cardiovascular hypertension (CHTN), On Home Beta Blockers Rhythm:regular Rate:Normal     Neuro/Psych  Headaches, negative psych ROS   GI/Hepatic negative GI ROS, Neg liver ROS,   Endo/Other  negative endocrine ROS  Renal/GU negative Renal ROS  negative genitourinary   Musculoskeletal   Abdominal   Peds  Hematology negative hematology ROS (+)   Anesthesia Other Findings   Reproductive/Obstetrics (+) Pregnancy                           Anesthesia Physical Anesthesia Plan  ASA: II  Anesthesia Plan: Epidural   Post-op Pain Management:    Induction:   Airway Management Planned:   Additional Equipment:   Intra-op Plan:   Post-operative Plan:   Informed Consent: I have reviewed the patients History and Physical, chart, labs and discussed the procedure including the risks, benefits and alternatives for the proposed anesthesia with the patient or authorized representative who has indicated his/her understanding and acceptance.     Plan Discussed with:   Anesthesia Plan Comments:         Anesthesia Quick Evaluation

## 2013-05-08 NOTE — Progress Notes (Signed)
Pt up in bathroom  

## 2013-05-08 NOTE — Progress Notes (Signed)
BP taken after pt returned from bathroom.  U/A sent to lab

## 2013-05-09 ENCOUNTER — Encounter (HOSPITAL_COMMUNITY)
Admission: RE | Disposition: A | Discharge status: Home / Self Care | Payer: Self-pay | Source: Ambulatory Visit | Attending: Obstetrics & Gynecology

## 2013-05-09 ENCOUNTER — Encounter (HOSPITAL_COMMUNITY): Payer: Self-pay

## 2013-05-09 DIAGNOSIS — O10919 Unspecified pre-existing hypertension complicating pregnancy, unspecified trimester: Secondary | ICD-10-CM

## 2013-05-09 DIAGNOSIS — Z9889 Other specified postprocedural states: Secondary | ICD-10-CM

## 2013-05-09 HISTORY — DX: Unspecified pre-existing hypertension complicating pregnancy, unspecified trimester: O10.919

## 2013-05-09 LAB — CBC
HCT: 32.9 % — ABNORMAL LOW (ref 36.0–46.0)
Hemoglobin: 11.4 g/dL — ABNORMAL LOW (ref 12.0–15.0)
MCH: 30.6 pg (ref 26.0–34.0)
MCHC: 34.7 g/dL (ref 30.0–36.0)

## 2013-05-09 SURGERY — Surgical Case
Anesthesia: Epidural | Site: Abdomen

## 2013-05-09 MED ORDER — KETOROLAC TROMETHAMINE 60 MG/2ML IM SOLN
INTRAMUSCULAR | Status: AC
  Filled 2013-05-09: qty 2

## 2013-05-09 MED ORDER — DIBUCAINE 1 % RE OINT: 1.0000 "application " | TOPICAL_OINTMENT | RECTAL | Status: DC | PRN

## 2013-05-09 MED ORDER — NALBUPHINE SYRINGE 5 MG/0.5 ML
5.0000 mg | INJECTION | INTRAMUSCULAR | Status: DC | PRN
  Filled 2013-05-09: qty 1

## 2013-05-09 MED ORDER — SIMETHICONE 80 MG PO CHEW
80.0000 mg | CHEWABLE_TABLET | Freq: Three times a day (TID) | ORAL | Status: DC
  Administered 2013-05-09 – 2013-05-12 (×11): 80 mg via ORAL
  Filled 2013-05-09 (×9): qty 1

## 2013-05-09 MED ORDER — MEPERIDINE HCL 25 MG/ML IJ SOLN: 6.2500 mg | INTRAMUSCULAR | Status: DC | PRN

## 2013-05-09 MED ORDER — ONDANSETRON HCL 4 MG/2ML IJ SOLN
INTRAMUSCULAR | Status: AC
  Filled 2013-05-09: qty 2

## 2013-05-09 MED ORDER — SIMETHICONE 80 MG PO CHEW: 80.0000 mg | CHEWABLE_TABLET | ORAL | Status: DC | PRN

## 2013-05-09 MED ORDER — WITCH HAZEL-GLYCERIN EX PADS: 1.0000 "application " | MEDICATED_PAD | CUTANEOUS | Status: DC | PRN

## 2013-05-09 MED ORDER — SIMETHICONE 80 MG PO CHEW
80.0000 mg | CHEWABLE_TABLET | ORAL | Status: DC
  Administered 2013-05-09 – 2013-05-10 (×2): 80 mg via ORAL
  Filled 2013-05-09 (×3): qty 1

## 2013-05-09 MED ORDER — OXYTOCIN 10 UNIT/ML IJ SOLN
40.0000 [IU] | INTRAVENOUS | Status: DC | PRN
  Administered 2013-05-09: 40 [IU] via INTRAVENOUS

## 2013-05-09 MED ORDER — SODIUM CHLORIDE 0.9 % IJ SOLN: 3.0000 mL | INTRAMUSCULAR | Status: DC | PRN

## 2013-05-09 MED ORDER — KETOROLAC TROMETHAMINE 30 MG/ML IJ SOLN: 30.0000 mg | Freq: Four times a day (QID) | INTRAMUSCULAR | Status: DC | PRN

## 2013-05-09 MED ORDER — SODIUM BICARBONATE 8.4 % IV SOLN
INTRAVENOUS | Status: DC | PRN
  Administered 2013-05-09 (×4): 5 mL via EPIDURAL

## 2013-05-09 MED ORDER — PHENYLEPHRINE HCL 10 MG/ML IJ SOLN
INTRAMUSCULAR | Status: DC | PRN
  Administered 2013-05-09: 120 ug via INTRAVENOUS

## 2013-05-09 MED ORDER — ONDANSETRON HCL 4 MG PO TABS: 4.0000 mg | ORAL_TABLET | ORAL | Status: DC | PRN

## 2013-05-09 MED ORDER — MEPERIDINE HCL 25 MG/ML IJ SOLN
INTRAMUSCULAR | Status: DC | PRN
  Administered 2013-05-09 (×2): 12.5 mg via INTRAVENOUS

## 2013-05-09 MED ORDER — FENTANYL CITRATE 0.05 MG/ML IJ SOLN: 25.0000 ug | INTRAMUSCULAR | Status: DC | PRN

## 2013-05-09 MED ORDER — METOCLOPRAMIDE HCL 5 MG/ML IJ SOLN: 10.0000 mg | Freq: Three times a day (TID) | INTRAMUSCULAR | Status: DC | PRN

## 2013-05-09 MED ORDER — MORPHINE SULFATE 0.5 MG/ML IJ SOLN
INTRAMUSCULAR | Status: AC
  Filled 2013-05-09: qty 10

## 2013-05-09 MED ORDER — ONDANSETRON HCL 4 MG/2ML IJ SOLN
INTRAMUSCULAR | Status: DC | PRN
  Administered 2013-05-09: 4 mg via INTRAVENOUS

## 2013-05-09 MED ORDER — DIPHENHYDRAMINE HCL 25 MG PO CAPS: 25.0000 mg | ORAL_CAPSULE | Freq: Four times a day (QID) | ORAL | Status: DC | PRN

## 2013-05-09 MED ORDER — ONDANSETRON HCL 4 MG/2ML IJ SOLN: 4.0000 mg | Freq: Three times a day (TID) | INTRAMUSCULAR | Status: DC | PRN

## 2013-05-09 MED ORDER — MAGNESIUM HYDROXIDE 400 MG/5ML PO SUSP: 30.0000 mL | ORAL | Status: DC | PRN

## 2013-05-09 MED ORDER — OXYTOCIN 40 UNITS IN LACTATED RINGERS INFUSION - SIMPLE MED: 62.5000 mL/h | INTRAVENOUS | Status: AC

## 2013-05-09 MED ORDER — LACTATED RINGERS IV SOLN
INTRAVENOUS | Status: DC
  Administered 2013-05-09: 11:00:00 via INTRAVENOUS

## 2013-05-09 MED ORDER — NALOXONE HCL 1 MG/ML IJ SOLN
1.0000 ug/kg/h | INTRAVENOUS | Status: DC | PRN
  Filled 2013-05-09: qty 2

## 2013-05-09 MED ORDER — OXYTOCIN 10 UNIT/ML IJ SOLN
INTRAMUSCULAR | Status: AC
  Filled 2013-05-09: qty 4

## 2013-05-09 MED ORDER — DIPHENHYDRAMINE HCL 50 MG/ML IJ SOLN: 25.0000 mg | INTRAMUSCULAR | Status: DC | PRN

## 2013-05-09 MED ORDER — SCOPOLAMINE 1 MG/3DAYS TD PT72
1.0000 | MEDICATED_PATCH | Freq: Once | TRANSDERMAL | Status: DC
  Administered 2013-05-09: 1.5 mg via TRANSDERMAL

## 2013-05-09 MED ORDER — LACTATED RINGERS IV SOLN: INTRAVENOUS | Status: DC | PRN

## 2013-05-09 MED ORDER — ZOLPIDEM TARTRATE 5 MG PO TABS: 5.0000 mg | ORAL_TABLET | Freq: Every evening | ORAL | Status: DC | PRN

## 2013-05-09 MED ORDER — PHENYLEPHRINE 40 MCG/ML (10ML) SYRINGE FOR IV PUSH (FOR BLOOD PRESSURE SUPPORT)
PREFILLED_SYRINGE | INTRAVENOUS | Status: AC
  Filled 2013-05-09: qty 5

## 2013-05-09 MED ORDER — OXYCODONE-ACETAMINOPHEN 5-325 MG PO TABS
1.0000 | ORAL_TABLET | ORAL | Status: DC | PRN
  Administered 2013-05-09: 1 via ORAL
  Filled 2013-05-09 (×2): qty 1

## 2013-05-09 MED ORDER — LANOLIN HYDROUS EX OINT: 1.0000 "application " | TOPICAL_OINTMENT | CUTANEOUS | Status: DC | PRN

## 2013-05-09 MED ORDER — FERROUS SULFATE 325 (65 FE) MG PO TABS
325.0000 mg | ORAL_TABLET | Freq: Two times a day (BID) | ORAL | Status: DC
  Administered 2013-05-09 – 2013-05-12 (×6): 325 mg via ORAL
  Filled 2013-05-09 (×7): qty 1

## 2013-05-09 MED ORDER — METOCLOPRAMIDE HCL 5 MG/ML IJ SOLN: 10.0000 mg | Freq: Once | INTRAMUSCULAR | Status: DC | PRN

## 2013-05-09 MED ORDER — BUPIVACAINE HCL (PF) 0.25 % IJ SOLN
INTRAMUSCULAR | Status: DC | PRN
  Administered 2013-05-09: 20 mL

## 2013-05-09 MED ORDER — CEFAZOLIN SODIUM-DEXTROSE 2-3 GM-% IV SOLR
INTRAVENOUS | Status: AC
  Filled 2013-05-09: qty 50

## 2013-05-09 MED ORDER — MEPERIDINE HCL 25 MG/ML IJ SOLN
INTRAMUSCULAR | Status: AC
  Filled 2013-05-09: qty 1

## 2013-05-09 MED ORDER — SENNOSIDES-DOCUSATE SODIUM 8.6-50 MG PO TABS
2.0000 | ORAL_TABLET | ORAL | Status: DC
  Administered 2013-05-09 – 2013-05-11 (×3): 2 via ORAL
  Filled 2013-05-09 (×3): qty 2

## 2013-05-09 MED ORDER — DIPHENHYDRAMINE HCL 25 MG PO CAPS: 25.0000 mg | ORAL_CAPSULE | ORAL | Status: DC | PRN

## 2013-05-09 MED ORDER — CEFAZOLIN SODIUM-DEXTROSE 2-3 GM-% IV SOLR
INTRAVENOUS | Status: DC | PRN
  Administered 2013-05-09: 2 g via INTRAVENOUS

## 2013-05-09 MED ORDER — MORPHINE SULFATE (PF) 0.5 MG/ML IJ SOLN
INTRAMUSCULAR | Status: DC | PRN
  Administered 2013-05-09: 1 mg via INTRAVENOUS

## 2013-05-09 MED ORDER — MORPHINE SULFATE (PF) 0.5 MG/ML IJ SOLN
INTRAMUSCULAR | Status: DC | PRN
  Administered 2013-05-09: 4 mg via EPIDURAL

## 2013-05-09 MED ORDER — TETANUS-DIPHTH-ACELL PERTUSSIS 5-2.5-18.5 LF-MCG/0.5 IM SUSP: 0.5000 mL | Freq: Once | INTRAMUSCULAR | Status: DC

## 2013-05-09 MED ORDER — BUPIVACAINE HCL (PF) 0.25 % IJ SOLN
INTRAMUSCULAR | Status: AC
  Filled 2013-05-09: qty 30

## 2013-05-09 MED ORDER — DIPHENHYDRAMINE HCL 50 MG/ML IJ SOLN: 12.5000 mg | INTRAMUSCULAR | Status: DC | PRN

## 2013-05-09 MED ORDER — IBUPROFEN 600 MG PO TABS
600.0000 mg | ORAL_TABLET | Freq: Four times a day (QID) | ORAL | Status: DC
  Administered 2013-05-09 – 2013-05-12 (×12): 600 mg via ORAL
  Filled 2013-05-09 (×13): qty 1

## 2013-05-09 MED ORDER — LABETALOL HCL 300 MG PO TABS
300.0000 mg | ORAL_TABLET | Freq: Three times a day (TID) | ORAL | Status: DC
  Administered 2013-05-09 – 2013-05-12 (×10): 300 mg via ORAL
  Filled 2013-05-09 (×10): qty 1

## 2013-05-09 MED ORDER — MENTHOL 3 MG MT LOZG: 1.0000 | LOZENGE | OROMUCOSAL | Status: DC | PRN

## 2013-05-09 MED ORDER — 0.9 % SODIUM CHLORIDE (POUR BTL) OPTIME
TOPICAL | Status: DC | PRN
  Administered 2013-05-09: 1000 mL

## 2013-05-09 MED ORDER — PRENATAL MULTIVITAMIN CH
1.0000 | ORAL_TABLET | Freq: Every day | ORAL | Status: DC
  Administered 2013-05-10 – 2013-05-11 (×2): 1 via ORAL
  Filled 2013-05-09 (×2): qty 1

## 2013-05-09 MED ORDER — KETOROLAC TROMETHAMINE 60 MG/2ML IM SOLN
60.0000 mg | Freq: Once | INTRAMUSCULAR | Status: AC | PRN
  Administered 2013-05-09: 60 mg via INTRAMUSCULAR

## 2013-05-09 MED ORDER — SCOPOLAMINE 1 MG/3DAYS TD PT72
MEDICATED_PATCH | TRANSDERMAL | Status: AC
  Filled 2013-05-09: qty 1

## 2013-05-09 MED ORDER — NALOXONE HCL 0.4 MG/ML IJ SOLN: 0.4000 mg | INTRAMUSCULAR | Status: DC | PRN

## 2013-05-09 MED ORDER — ONDANSETRON HCL 4 MG/2ML IJ SOLN: 4.0000 mg | INTRAMUSCULAR | Status: DC | PRN

## 2013-05-09 SURGICAL SUPPLY — 39 items
ADH SKN CLS APL DERMABOND .7 (GAUZE/BANDAGES/DRESSINGS) ×1
BLADE SURG CLIPPER 3M 9600 (MISCELLANEOUS) ×1 IMPLANT
CLAMP CORD UMBIL (MISCELLANEOUS) ×1 IMPLANT
CLOTH BEACON ORANGE TIMEOUT ST (SAFETY) ×2 IMPLANT
CONTAINER PREFILL 10% NBF 15ML (MISCELLANEOUS) IMPLANT
DERMABOND ADVANCED (GAUZE/BANDAGES/DRESSINGS) ×1
DERMABOND ADVANCED .7 DNX12 (GAUZE/BANDAGES/DRESSINGS) IMPLANT
DRAPE LG THREE QUARTER DISP (DRAPES) ×1 IMPLANT
DRSG OPSITE POSTOP 4X10 (GAUZE/BANDAGES/DRESSINGS) ×2 IMPLANT
DURAPREP 26ML APPLICATOR (WOUND CARE) ×2 IMPLANT
ELECT REM PT RETURN 9FT ADLT (ELECTROSURGICAL) ×2
ELECTRODE REM PT RTRN 9FT ADLT (ELECTROSURGICAL) ×1 IMPLANT
EXTRACTOR VACUUM M CUP 4 TUBE (SUCTIONS) IMPLANT
GLOVE BIO SURGEON STRL SZ 6.5 (GLOVE) ×2 IMPLANT
GLOVE BIOGEL PI IND STRL 7.0 (GLOVE) ×1 IMPLANT
GLOVE BIOGEL PI INDICATOR 7.0 (GLOVE) ×1
GOWN PREVENTION PLUS XLARGE (GOWN DISPOSABLE) ×4 IMPLANT
GOWN STRL REIN XL XLG (GOWN DISPOSABLE) ×4 IMPLANT
KIT ABG SYR 3ML LUER SLIP (SYRINGE) IMPLANT
NDL HYPO 25X5/8 SAFETYGLIDE (NEEDLE) IMPLANT
NEEDLE HYPO 22GX1.5 SAFETY (NEEDLE) ×2 IMPLANT
NEEDLE HYPO 25X5/8 SAFETYGLIDE (NEEDLE) IMPLANT
PACK C SECTION WH (CUSTOM PROCEDURE TRAY) ×2 IMPLANT
PAD OB MATERNITY 4.3X12.25 (PERSONAL CARE ITEMS) ×2 IMPLANT
RTRCTR C-SECT PINK 25CM LRG (MISCELLANEOUS) ×2 IMPLANT
STAPLER VISISTAT 35W (STAPLE) ×1 IMPLANT
SUT PLAIN 0 NONE (SUTURE) IMPLANT
SUT VIC AB 0 CT1 27 (SUTURE) ×4
SUT VIC AB 0 CT1 27XBRD ANBCTR (SUTURE) ×2 IMPLANT
SUT VIC AB 0 CTX 36 (SUTURE) ×6
SUT VIC AB 0 CTX36XBRD ANBCTRL (SUTURE) ×2 IMPLANT
SUT VIC AB 2-0 CT1 27 (SUTURE) ×2
SUT VIC AB 2-0 CT1 TAPERPNT 27 (SUTURE) ×1 IMPLANT
SUT VIC AB 3-0 SH 27 (SUTURE)
SUT VIC AB 3-0 SH 27X BRD (SUTURE) IMPLANT
SYR CONTROL 10ML LL (SYRINGE) ×2 IMPLANT
TOWEL OR 17X24 6PK STRL BLUE (TOWEL DISPOSABLE) ×2 IMPLANT
TRAY FOLEY CATH 14FR (SET/KITS/TRAYS/PACK) ×1 IMPLANT
WATER STERILE IRR 1000ML POUR (IV SOLUTION) IMPLANT

## 2013-05-09 NOTE — Progress Notes (Signed)
Subjective: Doing well, pain controled, UCs q2-3 min  Anesthesia epidural   Objective: BP 149/82  Pulse 98  Temp(Src) 98.6 F (37 C) (Oral)  Resp 18  Ht 5\' 8"  (1.727 m)  Wt 80.74 kg (178 lb)  BMI 27.07 kg/m2  SpO2 98%  LMP 08/13/2012   FHT:  FHR: 140's bpm, variability: moderate,  accelerations:  Present,  decelerations:  Absent UC:   regular, every 2-3 minutes VE:   6/80%/Vtx/-2   Assessment / Plan: Arrest of decent.  FPD.  CHTN/PIH, no evidence of PEC.  Fetal Wellbeing:  Category I Pain Control:  Epidural  Anticipated MOD:  Urgent primary C/S.  Surgery and risks reviewed, informed consent signed.  Kayla Hill,MARIE-LYNE 05/09/2013, 1:26 AM

## 2013-05-09 NOTE — Anesthesia Postprocedure Evaluation (Signed)
  Anesthesia Post-op Note  Patient: Kayla Hill  Procedure(s) Performed: Procedure(s): Primary Cesarean Section Delivery Baby Girl @ 0203, Apgars 9/9 (N/A)  Patient Location: Mother/Baby  Anesthesia Type:Epidural  Level of Consciousness: awake  Airway and Oxygen Therapy: Patient Spontanous Breathing  Post-op Pain: mild  Post-op Assessment: Patient's Cardiovascular Status Stable and Respiratory Function Stable  Post-op Vital Signs: stable  Complications: No apparent anesthesia complications

## 2013-05-09 NOTE — Transfer of Care (Signed)
Immediate Anesthesia Transfer of Care Note  Patient: Kayla Hill  Procedure(s) Performed: Procedure(s): Primary Cesarean Section Delivery Baby Girl @ 0203, Apgars 9/9 (N/A)  Patient Location: PACU  Anesthesia Type:Epidural  Level of Consciousness: awake, alert  and oriented  Airway & Oxygen Therapy: Patient Spontanous Breathing  Post-op Assessment: Report given to PACU RN and Post -op Vital signs reviewed and stable  Post vital signs: Reviewed and stable  Complications: No apparent anesthesia complications

## 2013-05-09 NOTE — H&P (Signed)
Subjective:  Kayla Hill is a 42 y.o. G4 P0 A3 female with EDC 05/20/2013 at 109 and 2/[redacted] weeks gestation who is being admitted for induction of labor.  Her current obstetrical history is significant for cHTN/PIH on 2 anti-HTN meds.  Patient reports no complaints.   Fetal Movement: normal.     Objective:   Vital signs in last 24 hours: Temp:  [97.5 F (36.4 C)-98.8 F (37.1 C)] 98.6 F (37 C) (12/11 2231) Pulse Rate:  [61-100] 92 (12/12 0131) Resp:  [16-20] 18 (12/12 0131) BP: (135-181)/(73-101) 156/100 mmHg (12/12 0131) SpO2:  [98 %-99 %] 98 % (12/11 2015) Weight:  [80.74 kg (178 lb)] 80.74 kg (178 lb) (12/11 0749)   General:   alert  Skin:   normal  HEENT:  PERRLA  Lungs:   clear to auscultation bilaterally  Heart:   regular rate and rhythm  Breasts:   Deferred  Abdomen:  Gravid  Pelvis:  Exam deferred.  FHT:  140 BPM, good variability, accelerations present, no deceleration  Uterine Size: size equals dates  Presentations: cephalic  Cervix:    Dilation: 2cm   Effacement: 50%   Station:  -3   Consistency: medium   Position: posterior   Lab Review  Rh+  AFP:NML  One hour GTT: Normal   GBS neg  Assessment/Plan:  38 and 2/[redacted] weeks gestation.  Fetal well-being reassuring.   Not in labor for induction Obstetrical history significant for cHTN/ PIH on Labetalol/Hydralazine.     Risks, benefits, alternatives and possible complications have been discussed in detail with the patient.  Pre-admission, admission, and post admission procedures and expectations were discussed in detail.  All questions answered, all appropriate consents will be signed at the Hospital. Admission is planned for today.  Induction Pitocin/AROM.  PIH labs wnl.

## 2013-05-09 NOTE — Consult Note (Signed)
Neonatology Note:  Attendance at C-section:  I was asked by Dr. Lavoie to attend this primary C/S at term due to FTP. The mother is a G4P0A3 O pos, GBS neg with chronic HTN with superimposed PIH, on Labetalol and Hydralazine. Earlier fetal ultrasound had shown echogenic bowel, but this was not seen on the most recent study. ROM 16 hours prior to delivery, fluid clear. Infant vigorous with good spontaneous cry and tone. Needed only minimal bulb suctioning. Ap 9/9. Lungs clear to ausc in DR. To CN to care of Pediatrician.  Ayra Hodgdon C. Edina Winningham, MD  

## 2013-05-09 NOTE — Op Note (Signed)
Preoperative diagnosis: Intrauterine pregnancy at 38  weeks and 3 days                                             CHTN/PIH                                            Arrest of descent  Post operative diagnosis: Same  Anesthesia: Epidural  Anesthesiologist: Dr. Rodman Pickle  Procedure: Primary urgent low transverse cesarean section  Surgeon: Dr. Genia Del  Assistant: None   Estimated blood loss: 600 cc  Procedure:  After being informed of the planned procedure and possible complications including bleeding, infection, injury to other organs, informed consent is obtained. The patient is taken to OR #9 and raising the epidural anesthesia level without complication. She is placed in the dorsal decubitus position with the pelvis tilted to the left. She is then prepped and draped in a sterile fashion. A Foley catheter is already inserted in her bladder.  After assessing adequate level of anesthesia, we infiltrate the suprapubic area with 20 cc of Marcaine 0.25 and perform a Pfannenstiel incision which is brought down sharply to the fascia. The fascia is entered in a low transverse fashion. Linea alba is dissected. Peritoneum is entered in a midline fashion. An Alexis retractor is easily positioned. Visceral peritoneum is entered in a low transverse fashion allowing [place] to safely retract bladder by developing a bladder flap.  The myometrium is then entered in a low transverse fashion; first with knife and then extended bluntly. Amniotic fluid is tinted meconium. We assist the birth of a female  infant in cephalic presentation.  Two nuchal cords are present. Mouth and nose are suctioned. The baby is delivered. The cord is clamped and sectioned. The baby is given to the neonatologist present in the room.  10 cc of blood is drawn from the umbilical vein.The placenta is allowed to deliver spontaneously. It is complete and the cord has 3 vessels. Uterine revision is negative.  We proceed with  closure of the myometrium in 2 layers: First with a running locked suture of 0 Vicryl, then with a Lembert suture of 0 Vicryl imbricating the first one. Hemostasis is completed a figure of eight towards the right angle and with cauterization on peritoneal edges.  Both paracolic gutters are cleaned. Both tubes and ovaries are assessed and normal.  Satisfactory hemostasis is confirmed.  Retractors and sponges are removed. Under fascia hemostasis is completed with cauterization.  The parietal peritoneum is closed with a running suture of Vicryl 2-0. The fascia is then closed with 2 running sutures of 0 Vicryl meeting midline. The wound is irrigated with warm saline and hemostasis is completed with cauterization. The skin is closed with a subcuticular suture of 3-0 Monocryl and Steri-Strips.  Instrument and sponge count is complete x2. Estimated blood loss is 600 cc.  The procedure is well tolerated by the patient who is taken to recovery room in a well and stable condition.  female baby was born at 2:03 am and received an Apgar of 9  at 1 minute and 9 at 5 minutes. Weight was pending.   Specimen: Placenta sent to patho.   Seira Cody,MARIE-LYNE MD 12/12/20142:42 AM

## 2013-05-09 NOTE — Anesthesia Postprocedure Evaluation (Signed)
  Anesthesia Post-op Note Anesthesia Post Note  Patient: Kayla Hill  Procedure(s) Performed: Procedure(s) (LRB): Primary Cesarean Section Delivery Baby Girl @ 0203, Apgars 9/9 (N/A)  Anesthesia type: Epidural  Patient location: PACU  Post pain: Pain level controlled  Post assessment: Post-op Vital signs reviewed  Last Vitals:  Filed Vitals:   05/09/13 0443  BP: 160/91  Pulse: 78  Temp: 37.1 C  Resp: 20    Post vital signs: stable  Level of consciousness: awake  Complications: No apparent anesthesia complications.  Peaked appearing T-waves on EKG in PACU, will send BMP.  Jasmine December, MD

## 2013-05-10 LAB — CBC
HCT: 30.6 % — ABNORMAL LOW (ref 36.0–46.0)
Hemoglobin: 10.4 g/dL — ABNORMAL LOW (ref 12.0–15.0)
MCH: 30.8 pg (ref 26.0–34.0)
MCHC: 34 g/dL (ref 30.0–36.0)
MCV: 90.5 fL (ref 78.0–100.0)
Platelets: 138 K/uL — ABNORMAL LOW (ref 150–400)
RBC: 3.38 MIL/uL — ABNORMAL LOW (ref 3.87–5.11)
RDW: 13.8 % (ref 11.5–15.5)
WBC: 11 K/uL — ABNORMAL HIGH (ref 4.0–10.5)

## 2013-05-10 LAB — CCBB MATERNAL DONOR DRAW

## 2013-05-10 NOTE — Lactation Note (Signed)
This note was copied from the chart of Kayla Hill. Lactation Consultation Note  Patient Name: Kayla Meredeth Furber WGNFA'O Date: 05/10/2013 Reason for consult: Initial assessment Mom has not been consistently pumping. Baby is not latching well and Mom is not very aggressive about feeding baby. Baby was awake at this visit, assisted Mom with latching baby using #20 nipple shield. Baby obtains more depth with latch in football hold. Lots of colostrum in nipple shield, Changed nipple shield size to 16 for better fit and baby was able to transfer colostrum as well. Mom needs lots of encouragement to support breast and keep baby actively BF. Stressed importance of baby nursing frequently for milk production, prevent too much weight loss, lots of basic teaching done. Encouraged Mom to keep baby actively nursing for 15-30 minutes on 1st breast, look for colostrum in the nipple shield, offer 2nd breast if baby still hungry. Advised baby should be at the breast every 2-3 hours. Advised if baby will not stay awake at the breast or Mom does not observe colostrum in the nipple shield, then she needs to continue to supplement according to guidelines given. Encouraged to post pump to encourage milk production. Reviewed how to clean pump pieces. Advised to ask for assist as needed.   Maternal Data    Feeding Feeding Type: Breast Fed Length of feed: 20 min (off and on)  LATCH Score/Interventions Latch: Repeated attempts needed to sustain latch, nipple held in mouth throughout feeding, stimulation needed to elicit sucking reflex. (using #20 nipple shield) Intervention(s): Adjust position;Assist with latch;Breast massage;Breast compression  Audible Swallowing: A few with stimulation  Type of Nipple: Everted at rest and after stimulation (short nipple shafts) Intervention(s): Double electric pump  Comfort (Breast/Nipple): Soft / non-tender     Hold (Positioning): Assistance needed to correctly  position infant at breast and maintain latch. Intervention(s): Breastfeeding basics reviewed;Support Pillows;Position options;Skin to skin  LATCH Score: 7  Lactation Tools Discussed/Used Tools: Nipple Dorris Carnes;Pump Nipple shield size: 16;20 Breast pump type: Double-Electric Breast Pump   Consult Status Consult Status: Follow-up Date: 05/11/13 Follow-up type: In-patient    Alfred Levins 05/10/2013, 11:56 PM

## 2013-05-10 NOTE — Progress Notes (Signed)
POD # 1  Subjective: Pt reports feeling good, sore/ Pain controlled with Motrin and Percocet Tolerating po/ Foley d/c'd and voiding without problems/ No n/v/ Flatus belching Activity: ad lib No HA, visual changes, or epigastric pain Bleeding is light Newborn info:  Information for the patient's newborn:  Ariannah, Arenson Girl Ikia [[id number]]  female Feeding: breast   Objective:  VS:  Filed Vitals:   05/09/13 2202 05/09/13 2357 05/10/13 0200 05/10/13 0625  BP: 125/75 148/83 122/74 148/82  Pulse: 68 68 72 65  Temp: 97.8 F (36.6 C)  97.8 F (36.6 C) 97.8 F (36.6 C)  TempSrc: Oral  Oral Oral  Resp: 20  20 20   Height:      Weight:      SpO2: 97%   98%     I&O: Intake/Output     12/12 0701 - 12/13 0700 12/13 0701 - 12/14 0700   P.O. 1340    I.V. (mL/kg)     Total Intake(mL/kg) 1340 (16.6)    Urine (mL/kg/hr) 1975 (1) 1000 (2.7)   Blood     Total Output 1975 1000   Net -635 -1000           Recent Labs  05/09/13 0320 05/10/13 0600  WBC 16.5* 11.0*  HGB 11.4* 10.4*  HCT 32.9* 30.6*  PLT 138* 138*    Blood type: --/--/O POS (12/12 0320) Rubella: Immune (05/19 0000)    Physical Exam:  General: alert and cooperative CV: Regular rate and rhythm Resp: CTA bilaterally Abdomen: soft, nontender, normal bowel sounds Incision: healing well, no drainage, no erythema, no hernia, no seroma, no swelling, well approximated with staples, honeycomb c/d/i Uterine Fundus: firm, below umbilicus, nontender Lochia: minimal Ext: edema trace to left foot and Homans sign is negative, no sign of DVT    Assessment: POD # 1/ [id number]/ S/P C/Section d/t arrest of labor Chronic HTN-stable Mild ABL anemia Doing well  Plan: Ambulate Continue Labetalol Continue Fe Continue routine post op orders   Signed: Donette Larry, N, MSN, CNM 05/10/2013, 11:41 AM

## 2013-05-11 NOTE — Progress Notes (Signed)
Patient ID: Kayla Hill, female   DOB: December 31, 1970, 42 y.o.   MRN: [id number] Subjective: POD# 2 Information for the patient's newborn:  Kayla Hill, Proffit Girl Heather [[id number]]  female   Reports feeling well Feeding: breast Patient reports tolerating PO.  Breast symptoms: none since using nipple shields Pain controlled with ibuprofen (OTC) and narcotic analgesics including Percocet Denies HA/SOB/C/P/N/V/dizziness. Flatus present. She reports vaginal bleeding as normal, without clots.  She is ambulating, urinating without difficult.     Objective:   VS:  Filed Vitals:   05/10/13 0625 05/10/13 1752 05/10/13 2310 05/11/13 0654  BP: 148/82 155/86 147/88 171/94  Pulse: 65 73  81  Temp: 97.8 F (36.6 C) 98.8 F (37.1 C)  97 F (36.1 C)  TempSrc: Oral Oral  Oral  Resp: 20 18  18   Height:      Weight:      SpO2: 98%   99%    No intake or output data in the 24 hours ending 05/11/13 0939      Recent Labs  05/09/13 0320 05/10/13 0600  WBC 16.5* 11.0*  HGB 11.4* 10.4*  HCT 32.9* 30.6*  PLT 138* 138*     Blood type: --/--/O POS (12/12 0320)  Rubella: Immune (05/19 0000)     Physical Exam:  General: alert, cooperative and no distress Abdomen: soft, nontender, normal bowel sounds Incision: Tegaderm and Honeycomb intact - skin well-approximated with staples Uterine Fundus: firm, 2 FB below umbilicus, nontender Lochia: minimal Ext: extremities normal, atraumatic, no cyanosis or edema and Homans sign is negative, no sign of DVT      Assessment/Plan: 42 y.o.   POD# 2.  s/p Cesarean Delivery.  Indications: Arrest of descent                Principal Problem:   Postpartum care following cesarean delivery (12/12) Active Problems:   Postoperative state   Chronic hypertension complicating or reason for care during pregnancy  Doing well, stable.               Advance diet as tolerated D/C saline lock Ambulate Routine post-op care Anticipate discharge tomorrow   Raelyn Mora, M, MSN, CNM 05/11/2013, 9:39 AM

## 2013-05-12 ENCOUNTER — Encounter (HOSPITAL_COMMUNITY): Payer: Self-pay | Admitting: Obstetrics & Gynecology

## 2013-05-12 MED ORDER — OXYCODONE-ACETAMINOPHEN 5-325 MG PO TABS: 1.0000 | ORAL_TABLET | ORAL | Status: AC | PRN

## 2013-05-12 MED ORDER — IBUPROFEN 600 MG PO TABS: 600.0000 mg | ORAL_TABLET | Freq: Four times a day (QID) | ORAL | Status: AC

## 2013-05-12 NOTE — Progress Notes (Signed)
POSTOPERATIVE DAY # 3 S/P cs   S:         Reports feeling well - just mild soreness             Tolerating po intake / no nausea / no vomiting / + flatus / no BM             Bleeding is light             Pain controlled with motrin and percocet             Up ad lib / ambulatory/ voiding QS  Newborn breast feeding     O:  VS: BP 172/104  Pulse 71  Temp(Src) 98 F (36.7 C) (Oral)  Resp 20  Ht 5\' 8"  (1.727 m)  Wt 80.74 kg (178 lb)  BMI 27.07 kg/m2  SpO2 99%  LMP 08/13/2012   LABS:               Recent Labs  05/10/13 0600  WBC 11.0*  HGB 10.4*  PLT 138*               Bloodtype: --/--/O POS (12/12 0320)  Rubella: Immune (05/19 0000)                                 Physical Exam:             Alert and Oriented X3  Lungs: Clear and unlabored  Heart: regular rate and rhythm / no mumurs  Abdomen: soft, non-tender, non-distended active BS             Fundus: firm, non-tender, Ueven             Dressing intact honeycomb              Incision:  no erythema / no ecchymosis / no drainage  Lochia: light   A:        POD # 3 S/P CS       P:        Routine postoperative care             DC home                 Marlinda Mike CNM, MSN, [facility] 05/12/2013, 10:36 AM

## 2013-05-12 NOTE — Discharge Summary (Signed)
POSTOPERATIVE DISCHARGE SUMMARY:  Patient ID: Kayla Hill MRN: [id number] DOB/AGE: [birth year] 42 y.o.  Admit date: 05/08/2013 Admission Diagnoses: 38.3 weeks chronic hypertension / AMA  Discharge date:  05/12/2013 Discharge Diagnoses: POD 3 s/p cesarean section for arrest of active labor / chronic hypertension  Prenatal history: [id number]   EDC : 05/20/2013, by Last Menstrual Period  Prenatal care at [facility]  Primary provider : Seymour Bars Prenatal course complicated by [facility] / hypertension  Prenatal Labs: ABO, Rh: --/--/O POS (12/12 0320) Antibody: Negative (05/19 0000) Rubella: Immune (05/19 0000)  RPR: NON REACTIVE (12/11 0755)  HBsAg: Negative (05/19 0000)  HIV: Non-reactive (05/19 0000)  GTT : NL GBS: Negative (11/11 0000)   Medical / Surgical History :  Past medical history:  Past Medical History  Diagnosis Date  . Migraines   . Hypertension   . Chronic hypertension with exacerbation during pregnancy in third trimester 04/09/2013  . Postpartum care following cesarean delivery 05/09/2013  . Chronic hypertension complicating or reason for care during pregnancy 05/09/2013  . Postpartum care following cesarean delivery (12/12) 05/09/2013    Past surgical history:  Past Surgical History  Procedure Laterality Date  . Nasal fracture surgery    . No past surgeries      Family History:  Family History  Problem Relation Age of Onset  . Hypertension Mother   . Hypertension Father   . Cancer Maternal Aunt   . Hypertension Maternal Aunt     Social History:  reports that she has never smoked. She has never used smokeless tobacco. She reports that she does not drink alcohol or use illicit drugs.  Allergies: Review of patient's allergies indicates no known allergies.   Current Medications at time of admission:  Prior to Admission medications   Medication Sig Start Date End Date Taking? Authorizing Provider  aspirin EC 81 MG tablet Take 81 mg by  mouth daily.   Yes Historical Provider, MD  calcium carbonate (TUMS - DOSED IN MG ELEMENTAL CALCIUM) 500 MG chewable tablet Chew 1 tablet by mouth 2 (two) times daily as needed for indigestion or heartburn.   Yes Historical Provider, MD  folic acid (FOLVITE) 1 MG tablet Take 1 tablet by mouth daily. 11/25/12  Yes Historical Provider, MD  hydrALAZINE (APRESOLINE) 10 MG tablet Take 1 tablet (10 mg total) by mouth every 6 (six) hours. 04/12/13  Yes Kelly A. Ernestina Penna, MD  labetalol (NORMODYNE) 300 MG tablet Take 1 tablet (300 mg total) by mouth every 8 (eight) hours. 04/12/13  Yes Kelly A. Ernestina Penna, MD  potassium chloride SA (K-DUR,KLOR-CON) 20 MEQ tablet Take 1 tablet (20 mEq total) by mouth 2 (two) times daily with a meal. 04/12/13  Yes Kelly A. Ernestina Penna, MD  Prenatal Vit-Fe Fumarate-FA (PRENATAL MULTIVITAMIN) TABS Take 1 tablet by mouth daily.   Yes Historical Provider, MD  ibuprofen (ADVIL,MOTRIN) 600 MG tablet Take 1 tablet (600 mg total) by mouth every 6 (six) hours. 05/12/13   Marlinda Mike, CNM  oxyCODONE-acetaminophen (PERCOCET/ROXICET) 5-325 MG per tablet Take 1-2 tablets by mouth every 4 (four) hours as needed for severe pain (moderate - severe pain). 05/12/13   Marlinda Mike, CNM     Intrapartum Course:  Admit for induction labor with labor progression to 6 dilation with protracted labor curve Pain management: epidural Complicated by: arrest of active labor despite augmentation Interventions required: CS delivery  Procedures: Cesarean section delivery on 05/09/2013 with delivery of  Female newborn by Dr Seymour Bars   See operative report  for further details APGAR (1 MIN): 9   APGAR (5 MINS): 9    Postoperative / postpartum course:  Uncomplicated with discharge on POD 3   Physical Exam:   VSS: Temp:  [97.8 F (36.6 C)-98 F (36.7 C)] 98 F (36.7 C) (12/14 2322) Pulse Rate:  [71-79] 71 (12/15 0906) Resp:  [20] 20 (12/14 2322) BP: (132-172)/(78-104) 172/104 mmHg (12/15 0906) SpO2:   [99 %] 99 % (12/14 2322)  LABS:  Recent Labs  05/10/13 0600  WBC 11.0*  HGB 10.4*  PLT 138*    General: pleasant / ambulatory / no pIH signs Heart: RRR Lungs: clear  Abdomen: soft and non-tender / non-distended / active BS  Extremities: trace edema / neg Homans  Dressing: intact honeycomb dressing Incision:  approximated with staples / no erythema / no ecchymosis / no drainage  Discharge Instructions:  Discharged Condition: stable  Activity: pelvic rest and postoperative restrictions x 2   Diet: routine  Medications:    Medication List    STOP taking these medications       calcium carbonate 500 MG chewable tablet  Commonly known as:  TUMS - dosed in mg elemental calcium     hydrALAZINE 10 MG tablet  Commonly known as:  APRESOLINE     potassium chloride SA 20 MEQ tablet  Commonly known as:  K-DUR,KLOR-CON      TAKE these medications       aspirin EC 81 MG tablet  Take 81 mg by mouth daily.     folic acid 1 MG tablet  Commonly known as:  FOLVITE  Take 1 tablet by mouth daily.     ibuprofen 600 MG tablet  Commonly known as:  ADVIL,MOTRIN  Take 1 tablet (600 mg total) by mouth every 6 (six) hours.     labetalol 300 MG tablet  Commonly known as:  NORMODYNE  Take 1 tablet (300 mg total) by mouth every 8 (eight) hours.     oxyCODONE-acetaminophen 5-325 MG per tablet  Commonly known as:  PERCOCET/ROXICET  Take 1-2 tablets by mouth every 4 (four) hours as needed for severe pain (moderate - severe pain).     prenatal multivitamin Tabs tablet  Take 1 tablet by mouth daily.        Wound Care: keep clean and dry  Postpartum Instructions: [facility] discharge booklet - instructions reviewed  Discharge to: Home  Follow up :  [facility] in 3 days for interval visit with midwife for BP recheck and staple removal [facility] in 6 weeks for routine postpartum visit with Seymour Bars                Signed: Marlinda Mike CNM, MSN, [facility] 05/12/2013, 10:46 AM

## 2013-05-12 NOTE — Progress Notes (Signed)
POSTOPERATIVE DAY # 3 S/P CS   S:         Reports feeling well - no PIH signs             Tolerating po intake / no nausea / no vomiting / + flatus / no BM             Bleeding is light             Pain controlled with motrin and percocet             Up ad lib / ambulatory/ voiding QS  Newborn breast feeding     O:  VS: BP 172/104  Pulse 71  Temp(Src) 98 F (36.7 C) (Oral)  Resp 20  Ht 5\' 8"  (1.727 m)  Wt 178 lb (80.74 kg)  BMI 27.07 kg/m2  SpO2 99%  LMP 08/13/2012  BP range: 132/78- 146/92   LABS:               Recent Labs  05/10/13 0600  WBC 11.0*  HGB 10.4*  PLT 138*               Bloodtype: --/--/O POS (12/12 0320)  Rubella: Immune (05/19 0000)                                  Physical Exam:             Alert and Oriented X3  Lungs: Clear and unlabored  Heart: regular rate and rhythm / no mumurs  Abdomen: soft, non-tender, non-distended active BS             Fundus: firm, non-tender, U-1             Dressing intact honeycomb              Incision:  approximated with staples / no erythema / non ecchymosis / no drainage  Perineum: intact  Lochia: light  Extremities: trace edema, no calf pain or tenderness, neg Homans  A:        POD # 3 S/P cs            Chronic HTN - labetalol 300 TID  P:        Routine postoperative care              BP check and staple removal in 3 days     Marlinda Mike CNM, MSN, [facility] 05/12/2013, 10:39 AM

## 2013-05-12 NOTE — Plan of Care (Signed)
Problem: Discharge Progression Outcomes Goal: Remove staples per MD order Outcome: Not Met (add Reason) Removal in office for 3 days

## 2013-05-13 NOTE — Discharge Summary (Signed)
Reviewed and agree with note and plan. V.Wynter Isaacs, MD  

## 2013-05-19 ENCOUNTER — Ambulatory Visit (HOSPITAL_COMMUNITY)
Admit: 2013-05-19 | Discharge: 2013-05-19 | Disposition: A | Discharge status: Home / Self Care | Payer: BC Managed Care – PPO | Attending: Obstetrics & Gynecology | Admitting: Obstetrics & Gynecology

## 2013-05-19 NOTE — Lactation Note (Signed)
Adult Lactation Consultation Outpatient Visit Note  Patient Name: Kayla Hill(MOTHER)    BABY: Kayla Hill Date of Birth: April 24, 1971                                DOB: [birth year] Gestational Age at Delivery: [redacted]w[redacted]d                BIRTH WEIGHT: 6-2.8 Type of Delivery:  C/S                                        DISCHARGE WEIGHT: 5-9.8(9%)                                                                    WEIGHT TODAY: 5-13.9 Breastfeeding History: Frequency of Breastfeeding: EVERY 1-2 HOURS Length of Feeding: 60 MINUTES Voids: 4 Stools: 4+ YELLOW  Supplementing / Method:NONE Pumping:  Type of Pump:MANUAL   Frequency:NONE  Volume:    Comments:    Consultation Evaluation:Mom and 10 day old baby here for feeding assessment. Mom states baby feeds all the time and sometimes is on the breast for 1-2 hours.  Baby is slow to gain.  Mom states baby was 5-12 in pedi office 5 days ago.  2 oz weight gain/5 days.  Baby latches easily with the 16 mm nipple shield and starts out active but then becomes sleepy.  Demonstrated breast massage/compression and baby responds well. Nipple shield filled with milk when baby finished.  Baby still appears jaundiced down to lower abdomen.  Discussed with mom plan to increase weight gain and possibly milk supply.  Mom has a DEBP coming from insurance co.  Until pump arrives mom will post pump every 3 hours x 10-15 minutes with manual pump and give any breast milk back to baby with bottle.  Instructed to record feedings and call MD if jaundice worsens.  [facility] nurse will weigh the baby in 1 week and mom will call us for an appointment if weight gain is not adequate.  Questions answered.  Initial Feeding Assessment: Pre-feed [id number] Post-feed [id number]:1478 Amount Transferred:18 MLS Comments:  Additional Feeding Assessment: Pre-feed [id number]:3086 Post-feed [id number]:[id number] Amount Transferred:35mls Comments:  Additional Feeding Assessment: Pre-feed  Weight: Post-feed Weight: Amount Transferred: Comments:  Total Breast milk Transferred this Visit: 34 mls Total Supplement Given: Mom will go home to pump and supplement with EBM  Additional Interventions:   Follow-Up smart start visit 05/26/13 or 05/27/13      Kayla Hill 05/19/2013, 3:48 PM

## 2014-03-30 ENCOUNTER — Encounter (HOSPITAL_COMMUNITY): Payer: Self-pay | Admitting: Obstetrics & Gynecology

## 2015-04-09 IMAGING — US US OB TRANSVAGINAL
1 series · 16 of 28 positions shown · non-contrast
Comparison: none

OBSTETRICS REPORT
                      (Signed Final 01/07/2013 [DATE])

Service(s) Provided
 US OB DETAIL + 14 WK                                  76811.0
 US OB TRANSVAGINAL                                    76817.0
Indications
 Detailed fetal anatomic survey
 Echogenic bowel
 Advanced maternal age (AMA), Multigravida
 Hypertension - Chronic/Pre-existing (on Aldomet
 and Labetalol)
 Placenta previa/Low lying: No bleeding
Fetal Evaluation
 Num Of Fetuses:    1
 Fetal Heart Rate:  150                          bpm
 Cardiac Activity:  Observed
 Presentation:      Cephalic
 Placenta:          Post Fundal, low lying,
                    1.6 from int os
 P. Cord            Visualized
 Insertion:
 Amniotic Fluid
 AFI FV:      Subjectively within normal limits
                                             Larg Pckt:     5.4  cm
Biometry
 BPD:     50.4  mm     G. Age:  21w 2d                CI:         76.2   70 - 86
 OFD:     66.1  mm                                    FL/HC:      17.2   15.9 -
 HC:     187.1  mm     G. Age:  21w 0d       42  %    HC/AC:      1.15   1.06 -
 AC:     162.8  mm     G. Age:  21w 3d       55  %    FL/BPD:
 FL:      32.2  mm     G. Age:  20w 0d       14  %    FL/AC:      19.8   20 - 24
 HUM:     31.3  mm     G. Age:  20w 3d       31  %
 CER:     23.4  mm     G. Age:  21w 5d       71  %
 Est. FW:     378  gm    0 lb 13 oz      40  %
Gestational Age
 LMP:           21w 0d        Date:  08/13/12                 EDD:   05/20/13
 U/S Today:     21w 0d                                        EDD:   05/20/13
 Best:          21w 0d     Det. By:  LMP  (08/13/12)          EDD:   05/20/13
Anatomy
 Cranium:          Appears normal         Aortic Arch:      Not well visualized
 Fetal Cavum:      Appears normal         Ductal Arch:      Not well visualized
 Ventricles:       Appears normal         Diaphragm:        Appears normal
 Choroid Plexus:   Appears normal         Stomach:          Appears normal
 Cerebellum:       Appears normal         Abdomen:          Appears normal
 Posterior Fossa:  Appears normal         Abdominal Wall:   Appears nml (cord
                                                            insert, abd wall)
 Nuchal Fold:      Appears normal         Cord Vessels:     Appears normal (3
                                                            vessel cord)
 Face:             Appears normal         Kidneys:          Appear normal
                   (orbits and profile)
 Lips:             Appears normal         Bladder:          Appears normal
 Palate:           Appears normal         Spine:            Appears normal
 Heart:            Appears normal         Lower             Appears normal
                   (4CH, axis, and        Extremities:
                   situs)
 RVOT:             Appears normal         Upper             Appears normal
                                          Extremities:
 LVOT:             Appears normal
 Other:  Fetus appears to be a female. Heels and 5th digit appear normal.
         Nasal bone visualized. Technically difficult due to fetal position.
Targeted Anatomy
 Fetal Central Nervous System
 Cisterna Magna:
Cervix Uterus Adnexa
 Cervical Length:    3.5      cm
 Cervix:       Measured transvaginally.
 Left Ovary:    Within normal limits.
 Right Ovary:   Not visualized.
 Adnexa:     No abnormality visualized.
Impression
INDICATION: 40/41 yr old [id number] at 62w9d with chronic
 hypertension for fetal anatomic survey. Finding of echogenic
 bowel on outside ultrasound.

[Series 1: us ob transvaginal · 0.23mm/px · 16 of 106 slices shown]
[im 1/106]
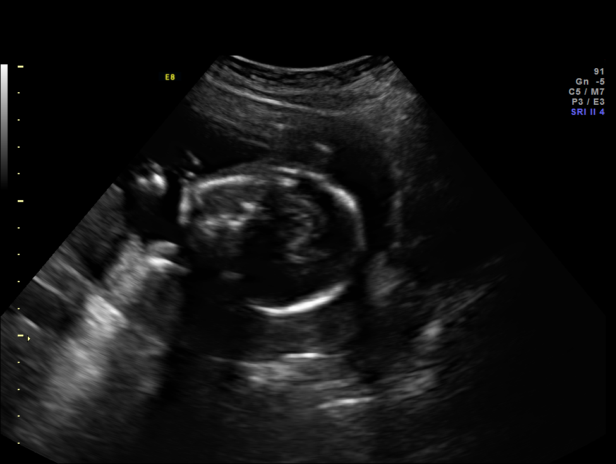
[im 8/106]
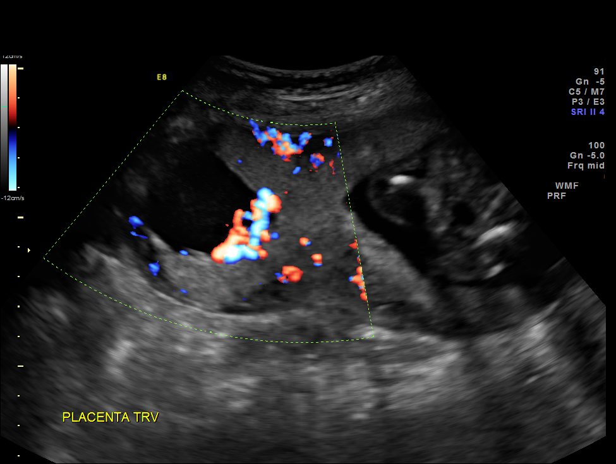
[im 16/106]
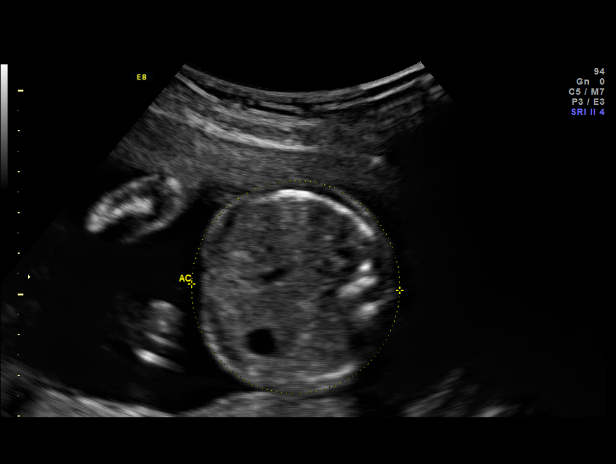
[im 24/106]
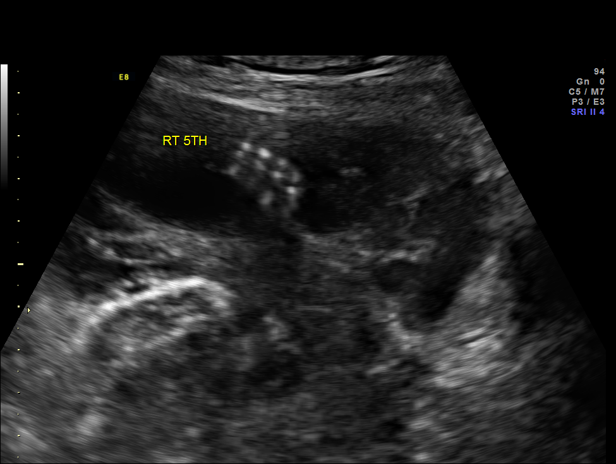
[im 28/106]
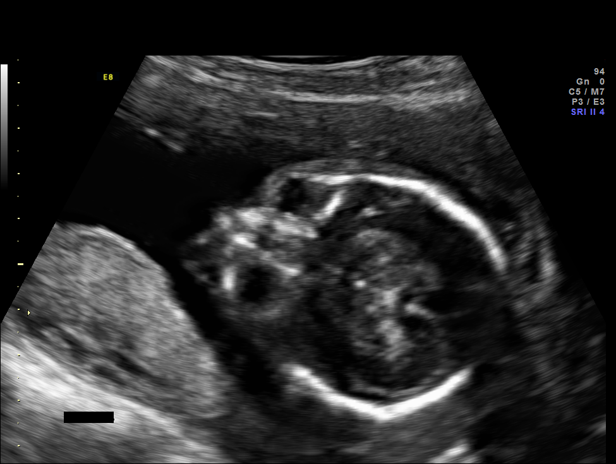
[im 36/106]
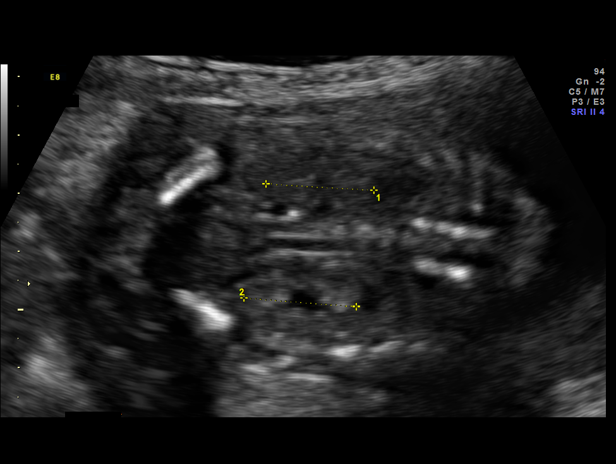
[im 43/106]
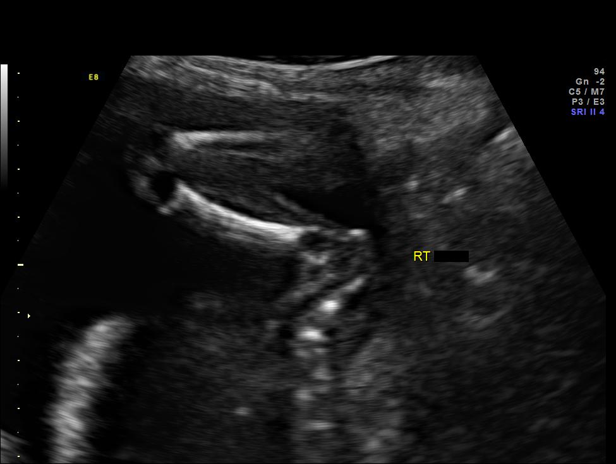
[im 51/106]
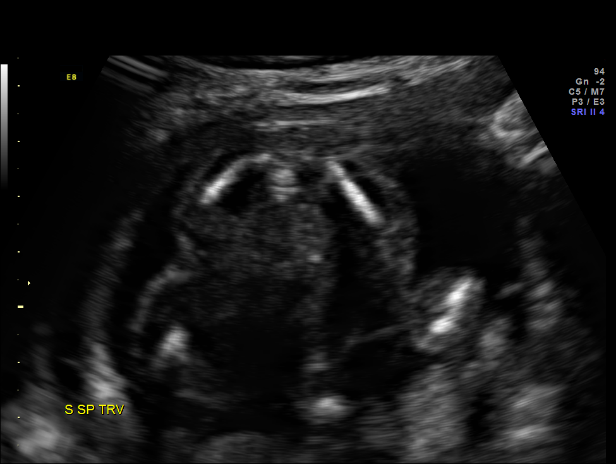
[im 55/106]
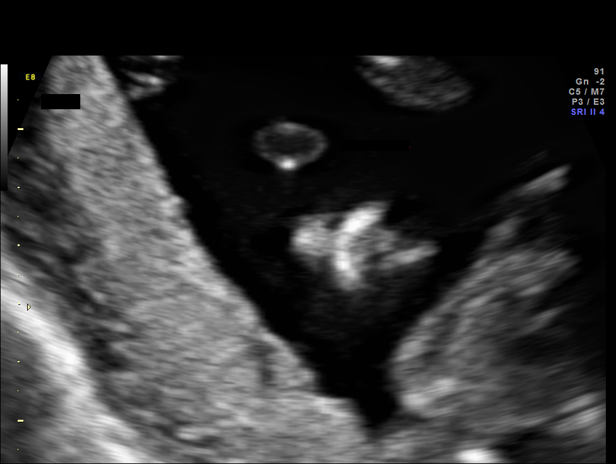
[im 63/106]
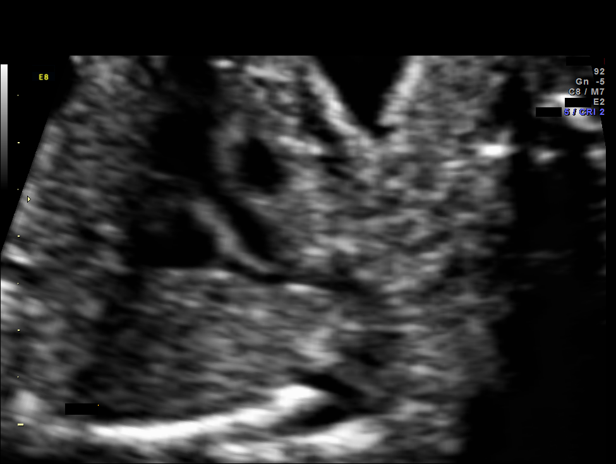
[im 71/106]
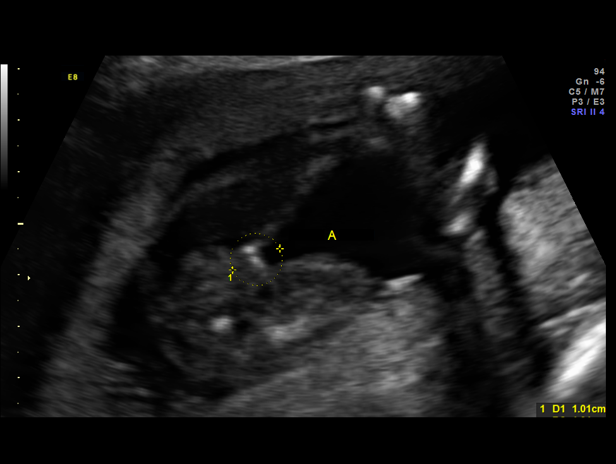
[im 78/106]
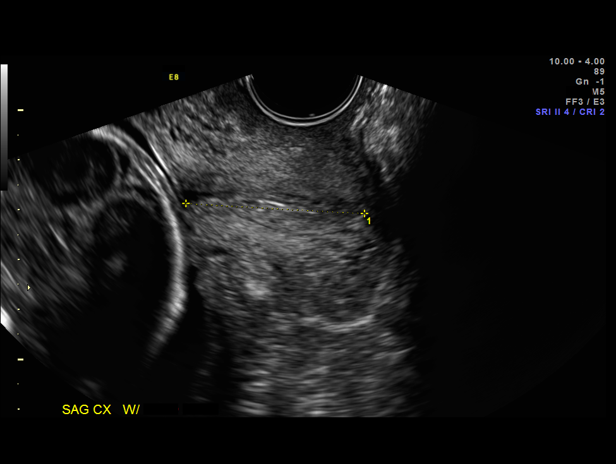
[im 82/106]
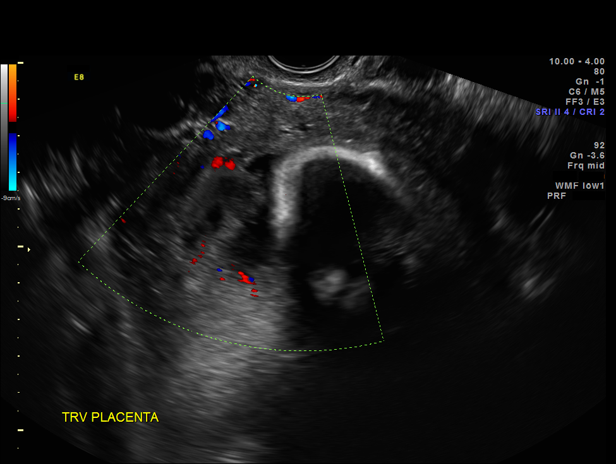
[im 90/106]
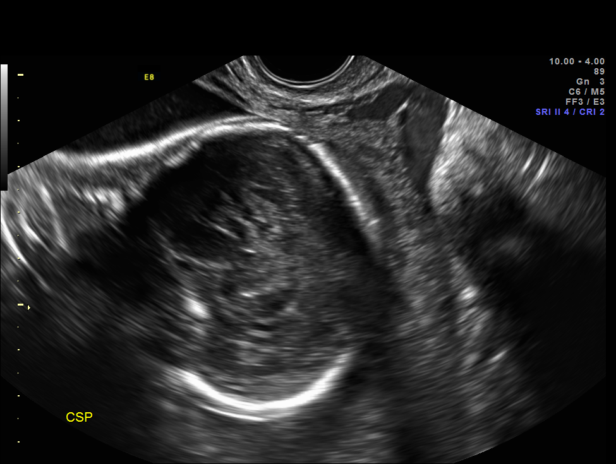
[im 98/106]
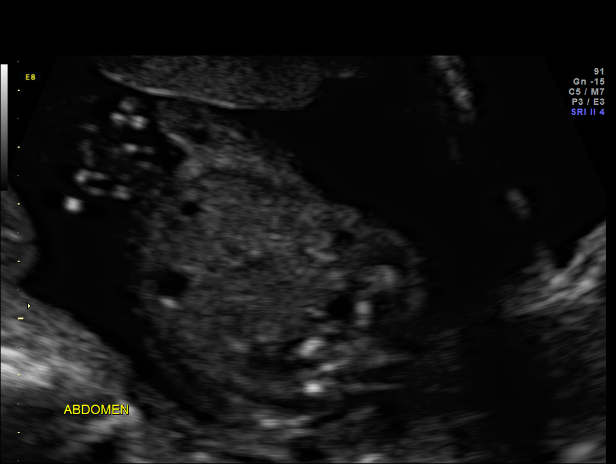
[im 106/106]
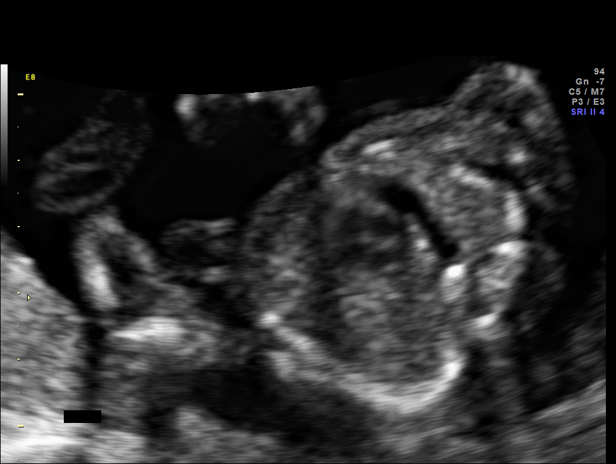

[16 of 28 positions shown; findings below may reference images not displayed]

FINDINGS: 1. Single intrauterine pregnancy.
 2. Fetal biometry is consistent with dating.
 3. Posterior placenta that is low lying; the placental edge is
 1.6cm from the internal cervical os.
 4. Normal amniotic fluid volume.
 5. Normal transvaginal cervical length.
 6. The views of the ductal and aortic arch are limited.
 7. The remainder of the limited anatomy survey is normal.
 The fetal bowel appears normal.
Recommendations

 1. Appropriate fetal growth.
 2. Limited anatomy survey:
 - recommend follow up in 4 weeks to complete anatomic
 survey and for fetal growth
 3. Chronic hypertension:
 - see consult letter
 - recommend fetal growth every 4 weeks
 - recommend antenatal testing starting at 32 weeks gestation
 - recommend delivery by estimated due date but not prior to
 38 weeks in the absence of other complications
 - recommend close surveillance for the development of
 signs/symptoms of preeclampsia
 4. Advanced maternal age:
 - see consult letter
 - patient met with genetic counselor; see separate report
 - after counseling patient opted to proceed with cell free fetal
 DNA which was drawn today
 - recommend fetal growth, antenatal testing, and delivery as
 above
 5. Previous finding of echogenic bowel:
 - see consult letter
 - not seen on today's ultrasound
 - cell free fetal DNA, cystic fibrosis, and toxomplasmosis and
 CMV IgG and IgM drawn today
 6. Low lying placenta:
 - see consult letter
 - no activity restrictions recommended unless bleeding
 develops
 - recommend bleeding precautions
 - recommend reevaluate placental location on follow up
 ultrasound

 questions or concerns.

## 2016-02-15 ENCOUNTER — Other Ambulatory Visit (HOSPITAL_COMMUNITY): Payer: Self-pay | Admitting: Obstetrics & Gynecology

## 2016-02-15 DIAGNOSIS — N979 Female infertility, unspecified: Secondary | ICD-10-CM

## 2016-02-18 ENCOUNTER — Ambulatory Visit (HOSPITAL_COMMUNITY)
Admission: RE | Admit: 2016-02-18 | Discharge: 2016-02-18 | Disposition: A | Discharge status: Home / Self Care | Payer: BC Managed Care – PPO | Source: Ambulatory Visit | Attending: Obstetrics & Gynecology | Admitting: Obstetrics & Gynecology

## 2016-02-18 ENCOUNTER — Other Ambulatory Visit: Payer: Self-pay | Admitting: Obstetrics & Gynecology

## 2016-02-18 DIAGNOSIS — N979 Female infertility, unspecified: Secondary | ICD-10-CM

## 2019-05-04 ENCOUNTER — Emergency Department (HOSPITAL_BASED_OUTPATIENT_CLINIC_OR_DEPARTMENT_OTHER): Payer: BC Managed Care – PPO

## 2019-05-04 ENCOUNTER — Encounter (HOSPITAL_BASED_OUTPATIENT_CLINIC_OR_DEPARTMENT_OTHER): Payer: Self-pay | Admitting: Emergency Medicine

## 2019-05-04 ENCOUNTER — Other Ambulatory Visit: Payer: Self-pay

## 2019-05-04 ENCOUNTER — Emergency Department (HOSPITAL_BASED_OUTPATIENT_CLINIC_OR_DEPARTMENT_OTHER)
Admission: EM | Admit: 2019-05-04 | Discharge: 2019-05-04 | Disposition: A | Discharge status: Home / Self Care | Payer: BC Managed Care – PPO | Attending: Emergency Medicine | Admitting: Emergency Medicine

## 2019-05-04 DIAGNOSIS — Y929 Unspecified place or not applicable: Secondary | ICD-10-CM | POA: Insufficient documentation

## 2019-05-04 DIAGNOSIS — Z79899 Other long term (current) drug therapy: Secondary | ICD-10-CM | POA: Insufficient documentation

## 2019-05-04 DIAGNOSIS — W109XXA Fall (on) (from) unspecified stairs and steps, initial encounter: Secondary | ICD-10-CM | POA: Insufficient documentation

## 2019-05-04 DIAGNOSIS — S60511A Abrasion of right hand, initial encounter: Secondary | ICD-10-CM | POA: Insufficient documentation

## 2019-05-04 DIAGNOSIS — S63612A Unspecified sprain of right middle finger, initial encounter: Secondary | ICD-10-CM | POA: Diagnosis not present

## 2019-05-04 DIAGNOSIS — Y999 Unspecified external cause status: Secondary | ICD-10-CM | POA: Diagnosis not present

## 2019-05-04 DIAGNOSIS — S62322A Displaced fracture of shaft of third metacarpal bone, right hand, initial encounter for closed fracture: Secondary | ICD-10-CM | POA: Diagnosis not present

## 2019-05-04 DIAGNOSIS — I10 Essential (primary) hypertension: Secondary | ICD-10-CM | POA: Insufficient documentation

## 2019-05-04 DIAGNOSIS — S6991XA Unspecified injury of right wrist, hand and finger(s), initial encounter: Secondary | ICD-10-CM | POA: Diagnosis present

## 2019-05-04 DIAGNOSIS — Y9301 Activity, walking, marching and hiking: Secondary | ICD-10-CM | POA: Insufficient documentation

## 2019-05-04 NOTE — ED Provider Notes (Signed)
[facility] EMERGENCY DEPARTMENT Provider Note   CSN: [id number] Arrival date & time: 05/04/19  1449     History   Chief Complaint Chief Complaint  Patient presents with   Fall    HPI Kayla Hill is a 48 y.o. female.     Patient presents with complaint of cute onset of right hand pain, swelling, abrasions.  Patient states that prior to arrival she was walking down stairs carrying some garbage.  She tripped and lost her balance.  She fell onto an outstretched right hand.  She is right-handed.  She sustained some abrasions to her fingers and hand but does not know exactly what cut her.  She has pain in her right long and ring fingers as well as her hand.  Pain is worse with movement.  No numbness or tingling.  Last tetanus shot approximately 6 years ago per patient history.  She denies hitting her head or losing consciousness.     Past Medical History:  Diagnosis Date   Chronic hypertension complicating or reason for care during pregnancy 05/09/2013   Chronic hypertension with exacerbation during pregnancy in third trimester 04/09/2013   Hypertension    Migraines    Postpartum care following cesarean delivery 05/09/2013   Postpartum care following cesarean delivery (12/12) 05/09/2013    Patient Active Problem List   Diagnosis Date Noted   Postoperative state 05/09/2013   Postpartum care following cesarean delivery (12/12) 05/09/2013   Chronic hypertension complicating or reason for care during pregnancy 05/09/2013   Chronic hypertension with exacerbation during pregnancy in third trimester 04/09/2013    Past Surgical History:  Procedure Laterality Date   CESAREAN SECTION N/A 05/09/2013   Procedure: Primary Cesarean Section Delivery Baby Girl @ 0203, Apgars 9/9;  Surgeon: Genia Del, MD;  Location: [facility];  Service: Obstetrics;  Laterality: N/A;   NASAL FRACTURE SURGERY     NO PAST SURGERIES       OB History    Gravida  4   Para    1   Term  1   Preterm  0   AB  3   Living  1     SAB  2   TAB  1   Ectopic  0   Multiple  0   Live Births  1            Home Medications    Prior to Admission medications   Medication Sig Start Date End Date Taking? Authorizing Provider  amLODipine (NORVASC) 2.5 MG tablet Take 2.5 mg by mouth daily.   Yes [provider]  aspirin EC 81 MG tablet Take 81 mg by mouth daily.    [provider]  folic acid (FOLVITE) 1 MG tablet Take 1 tablet by mouth daily. 11/25/12   [provider]  ibuprofen (ADVIL,MOTRIN) 600 MG tablet Take 1 tablet (600 mg total) by mouth every 6 (six) hours. 05/12/13   Marlinda Mike, CNM  labetalol (NORMODYNE) 300 MG tablet Take 1 tablet (300 mg total) by mouth every 8 (eight) hours. 04/12/13   Noland Fordyce, MD  oxyCODONE-acetaminophen (PERCOCET/ROXICET) 5-325 MG per tablet Take 1-2 tablets by mouth every 4 (four) hours as needed for severe pain (moderate - severe pain). 05/12/13   Marlinda Mike, CNM  Prenatal Vit-Fe Fumarate-FA (PRENATAL MULTIVITAMIN) TABS Take 1 tablet by mouth daily.    [provider]    Family History Family History  Problem Relation Age of Onset   Hypertension Mother  Hypertension Father    Cancer Maternal Aunt    Hypertension Maternal Aunt     Social History Social History   Tobacco Use   Smoking status: Never Smoker   Smokeless tobacco: Never Used  Substance Use Topics   Alcohol use: No   Drug use: No     Allergies   Patient has no known allergies.   Review of Systems Review of Systems  Constitutional: Negative for activity change.  Musculoskeletal: Positive for arthralgias and joint swelling. Negative for back pain and neck pain.  Skin: Positive for wound.  Neurological: Negative for weakness and numbness.     Physical Exam Updated Vital Signs BP (!) 160/96 (BP Location: Left Arm)    Pulse 90    Temp 98.9 F (37.2 C) (Oral)    Resp 18    Ht 5\' 8"   (1.727 m)    Wt 63.5 kg    LMP 04/17/2019    SpO2 100%    BMI 21.29 kg/m   Physical Exam Vitals signs and nursing note reviewed.  Constitutional:      Appearance: She is well-developed.  HENT:     Head: Normocephalic and atraumatic.  Eyes:     Pupils: Pupils are equal, round, and reactive to light.  Neck:     Musculoskeletal: Normal range of motion and neck supple.  Cardiovascular:     Pulses: Normal pulses. No decreased pulses.          Radial pulses are 2+ on the right side.  Musculoskeletal:        General: Tenderness present.     Right shoulder: Normal.     Right elbow: Normal.    Right wrist: She exhibits tenderness. She exhibits normal range of motion and no bony tenderness.     Right hand: She exhibits decreased range of motion, tenderness, bony tenderness, deformity and swelling. She exhibits normal capillary refill and no laceration. Normal sensation noted. Decreased sensation is not present in the ulnar distribution, is not present in the medial distribution and is not present in the radial distribution. Normal strength noted.       Hands:  Skin:    General: Skin is warm and dry.  Neurological:     Mental Status: She is alert.     Sensory: No sensory deficit.     Comments: Motor, sensation, and vascular distal to the injury is fully intact.       ED Treatments / Results  Labs (all labs ordered are listed, but only abnormal results are displayed) Labs Reviewed - No data to display  EKG None  Radiology Dg Hand Complete Right  Result Date: 05/04/2019 CLINICAL DATA:  Fall. Status post injury EXAM: RIGHT HAND - COMPLETE 3+ VIEW COMPARISON:  None FINDINGS: There is an acute fracture deformity involving the mid shaft of the third metacarpal bone. There is mild volar displacement of the distal fracture fragments. No dislocations. IMPRESSION: Acute fracture involves the mid shaft of the third metacarpal bone with mild volar displacement of the distal fracture fragments.  Electronically Signed   By: Signa Kellaylor  Stroud M.D.   On: 05/04/2019 15:33    Procedures Procedures (including critical care time)  Medications Ordered in ED Medications - No data to display   Initial Impression / Assessment and Plan / ED Course  I have reviewed the triage vital signs and the nursing notes.  Pertinent labs & imaging results that were available during my care of the patient were reviewed by me and  considered in my medical decision making (see chart for details).        Patient seen and examined.  X-ray reviewed prior to exam.  I personally cleaned the patient's abrasions with Shur-Clens and gauze.  Patient has metacarpal fracture as described above.  Patient be placed in a short arm splint extending to the mid fingers.  With deviation of her right index finger.  I was able to straighten this out and I suspect that she sprained this digit.  I reviewed x-rays at patient's bedside and agree that patient does not have any definite fractures or dislocations of this finger.  Digits and hand are neurovascularly intact.  Vital signs reviewed and are as follows: BP (!) 160/96 (BP Location: Left Arm)    Pulse 90    Temp 98.9 F (37.2 C) (Oral)    Resp 18    Ht 5\' 8"  (1.727 m)    Wt 63.5 kg    LMP 04/17/2019    SpO2 100%    BMI 21.29 kg/m     Final Clinical Impressions(s) / ED Diagnoses   Final diagnoses:  Closed displaced fracture of shaft of third metacarpal bone of right hand, initial encounter  Abrasion of right hand, initial encounter  Sprain of right middle finger, unspecified site of digit, initial encounter   Patient with metacarpal fracture after fall.  Also several abrasions.  Suspect finger sprain as well.  Hand is neurovascularly intact.  Pain is controlled.  Patient splinted and will follow up with orthopedic hand surgery.  Tetanus up-to-date.   ED Discharge Orders    None       Carlisle Cater, Hershal Coria 05/04/19 1652    Quintella Reichert, MD 05/04/19 1750

## 2019-05-04 NOTE — ED Triage Notes (Signed)
Pt tripped down her steps and injured R hand.

## 2019-05-04 NOTE — Discharge Instructions (Signed)
Please read and follow all provided instructions.  Your diagnoses today include:  1. Closed displaced fracture of shaft of third metacarpal bone of right hand, initial encounter   2. Abrasion of right hand, initial encounter   3. Sprain of right middle finger, unspecified site of digit, initial encounter     Tests performed today include:  An x-ray of the affected area - shows metacarpal fracture, no finger fracture or dislocation   Vital signs. See below for your results today.   Medications prescribed:   None  Take any prescribed medications only as directed.  Home care instructions:   Follow any educational materials contained in this packet  Follow R.I.C.E. Protocol:  R - rest your injury   I  - use ice on injury without applying directly to skin  C - compress injury with bandage or splint  E - elevate the injury as much as possible  Follow-up instructions: You will need to follow-up with orthopedics to ensure appropriate treatment and for fracture healing.  Please perform good wound care of the abrasions on your fingers as best as possible.  Return instructions:   Please return if your fingers are numb or tingling, appear gray or blue, or you have severe pain (also elevate the arm and loosen splint or wrap if you were given one)  Return with worsening pain, swelling, redness in the areas around the cuts on her hands.  Please return to the Emergency Department if you experience worsening symptoms.   Please return if you have any other emergent concerns.  Additional Information:  Your vital signs today were: BP (!) 160/96 (BP Location: Left Arm)    Pulse 90    Temp 98.9 F (37.2 C) (Oral)    Resp 18    Ht 5\' 8"  (1.727 m)    Wt 63.5 kg    LMP 04/17/2019    SpO2 100%    BMI 21.29 kg/m  If your blood pressure (BP) was elevated above 135/85 this visit, please have this repeated by your doctor within one month. --------------

## 2019-05-04 NOTE — ED Notes (Signed)
EMT at bedside for splint

## 2021-03-11 ENCOUNTER — Other Ambulatory Visit: Payer: Self-pay | Admitting: Sports Medicine

## 2021-03-11 ENCOUNTER — Ambulatory Visit
Admission: RE | Admit: 2021-03-11 | Discharge: 2021-03-11 | Disposition: A | Discharge status: Home / Self Care | Payer: BC Managed Care – PPO | Source: Ambulatory Visit | Attending: Sports Medicine | Admitting: Sports Medicine

## 2021-03-11 DIAGNOSIS — M25562 Pain in left knee: Secondary | ICD-10-CM

## 2022-11-29 ENCOUNTER — Other Ambulatory Visit (HOSPITAL_BASED_OUTPATIENT_CLINIC_OR_DEPARTMENT_OTHER): Payer: Self-pay | Admitting: Family Medicine

## 2022-11-29 DIAGNOSIS — E7800 Pure hypercholesterolemia, unspecified: Secondary | ICD-10-CM

## 2022-12-23 ENCOUNTER — Other Ambulatory Visit (HOSPITAL_BASED_OUTPATIENT_CLINIC_OR_DEPARTMENT_OTHER): Payer: BC Managed Care – PPO

## 2023-01-09 ENCOUNTER — Ambulatory Visit (HOSPITAL_BASED_OUTPATIENT_CLINIC_OR_DEPARTMENT_OTHER)
Admission: RE | Admit: 2023-01-09 | Discharge: 2023-01-09 | Disposition: A | Discharge status: Home / Self Care | Payer: BC Managed Care – PPO | Source: Ambulatory Visit | Attending: Family Medicine | Admitting: Family Medicine

## 2023-01-09 DIAGNOSIS — E7800 Pure hypercholesterolemia, unspecified: Secondary | ICD-10-CM
